# Patient Record
Sex: Female | Born: 1970 | Race: White | Hispanic: No | Marital: Married | Smoking: Never smoker
Health system: Southern US, Community
[De-identification: ages and names within clinical notes are randomized; demographics above are authoritative.]

## PROBLEM LIST (undated history)

## (undated) DIAGNOSIS — S83519A Sprain of anterior cruciate ligament of unspecified knee, initial encounter: Secondary | ICD-10-CM

## (undated) DIAGNOSIS — Z811 Family history of alcohol abuse and dependence: Secondary | ICD-10-CM

## (undated) DIAGNOSIS — E559 Vitamin D deficiency, unspecified: Secondary | ICD-10-CM

## (undated) DIAGNOSIS — B019 Varicella without complication: Secondary | ICD-10-CM

## (undated) DIAGNOSIS — A630 Anogenital (venereal) warts: Secondary | ICD-10-CM

## (undated) DIAGNOSIS — N926 Irregular menstruation, unspecified: Secondary | ICD-10-CM

## (undated) DIAGNOSIS — R17 Unspecified jaundice: Secondary | ICD-10-CM

## (undated) HISTORY — DX: Sprain of anterior cruciate ligament of unspecified knee, initial encounter: S83.519A

## (undated) HISTORY — DX: Vitamin D deficiency, unspecified: E55.9

## (undated) HISTORY — DX: Family history of alcohol abuse and dependence: Z81.1

## (undated) HISTORY — DX: Unspecified jaundice: R17

## (undated) HISTORY — DX: Irregular menstruation, unspecified: N92.6

## (undated) HISTORY — DX: Anogenital (venereal) warts: A63.0

## (undated) HISTORY — DX: Varicella without complication: B01.9

## (undated) HISTORY — DX: Other disorders of bilirubin metabolism: E80.6

## (undated) HISTORY — PX: VARICOSE VEIN SURGERY: SHX832

---
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
## 2008-08-07 ENCOUNTER — Ambulatory Visit: Payer: Self-pay | Admitting: Family Medicine

## 2008-08-07 ENCOUNTER — Other Ambulatory Visit: Admission: RE | Admit: 2008-08-07 | Discharge: 2008-08-07 | Payer: Self-pay | Admitting: Family Medicine

## 2008-08-07 ENCOUNTER — Encounter: Payer: Self-pay | Admitting: Family Medicine

## 2008-08-07 DIAGNOSIS — R079 Chest pain, unspecified: Secondary | ICD-10-CM | POA: Insufficient documentation

## 2008-08-08 ENCOUNTER — Encounter: Payer: Self-pay | Admitting: Family Medicine

## 2008-08-08 LAB — CONVERTED CEMR LAB
AST: 16 units/L (ref 0–37)
Albumin: 4.4 g/dL (ref 3.5–5.2)
Alkaline Phosphatase: 37 units/L — ABNORMAL LOW (ref 39–117)
BUN: 13 mg/dL (ref 6–23)
Glucose, Bld: 93 mg/dL (ref 70–99)
HDL: 58 mg/dL (ref >39)
LDL Cholesterol: 112 mg/dL — ABNORMAL HIGH (ref 0–99)
Potassium: 4.4 meq/L (ref 3.5–5.3)
Sodium: 140 meq/L (ref 135–145)
TSH: 1.436 microintl units/mL (ref 0.350–4.500)
Total Bilirubin: 1.5 mg/dL — ABNORMAL HIGH (ref 0.3–1.2)
Total Protein: 6.8 g/dL (ref 6.0–8.3)
Triglycerides: 51 mg/dL (ref <150)
VLDL: 10 mg/dL (ref 0–40)

## 2008-08-10 ENCOUNTER — Telehealth: Payer: Self-pay | Admitting: Family Medicine

## 2008-08-10 ENCOUNTER — Encounter: Payer: Self-pay | Admitting: Family Medicine

## 2008-08-23 ENCOUNTER — Ambulatory Visit: Payer: Self-pay | Admitting: Cardiology

## 2008-08-23 ENCOUNTER — Encounter: Payer: Self-pay | Admitting: Cardiology

## 2008-09-07 ENCOUNTER — Ambulatory Visit: Payer: Self-pay

## 2008-09-07 ENCOUNTER — Encounter: Payer: Self-pay | Admitting: Cardiology

## 2009-09-19 ENCOUNTER — Ambulatory Visit: Payer: Self-pay | Admitting: Family Medicine

## 2009-09-19 DIAGNOSIS — I8310 Varicose veins of unspecified lower extremity with inflammation: Secondary | ICD-10-CM | POA: Insufficient documentation

## 2009-09-20 ENCOUNTER — Ambulatory Visit: Payer: Self-pay | Admitting: Vascular Surgery

## 2009-11-07 ENCOUNTER — Encounter: Payer: Self-pay | Admitting: Family Medicine

## 2009-11-07 ENCOUNTER — Ambulatory Visit: Payer: Self-pay | Admitting: Vascular Surgery

## 2010-01-08 ENCOUNTER — Encounter: Payer: Self-pay | Admitting: Family Medicine

## 2010-01-08 ENCOUNTER — Ambulatory Visit: Payer: Self-pay | Admitting: Vascular Surgery

## 2010-03-13 ENCOUNTER — Ambulatory Visit: Payer: Self-pay | Admitting: Vascular Surgery

## 2010-03-20 ENCOUNTER — Ambulatory Visit: Payer: Self-pay | Admitting: Vascular Surgery

## 2010-06-18 NOTE — Assessment & Plan Note (Signed)
Summary: varicose veins   Vital Signs:  Patient profile:   40 year old female Menstrual status:  irregular Height:      64 inches Weight:      158 pounds BMI:     27.22 O2 Sat:      100 % on Room air Temp:     98.9 degrees F oral Pulse rate:   58 / minute BP sitting:   114 / 57  (left arm) Cuff size:   regular  Vitals Entered By: Payton Spark CMA (Sep 19, 2009 10:21 AM)  O2 Flow:  Room air CC: ? Busted Vericose vein in R lower leg now painful.   Primary Care Provider:  Seymour Bars DO  CC:  ? Busted Vericose vein in R lower leg now painful.Marland Kitchen  History of Present Illness: 40 yo WF presents for an inflammed varicose vein on her RLE.  She banged it on her boat over the weekend and it became swollen and  bruised.  She has pain.  Ice did not help.  Her pain with walking is improving.  She also has a sunburn from the weekend.  She has a sedentary job, has never been pregnant and denies any previous leg throbbing or leg swelling.      Current Medications (verified): 1)  Tylenol Ex St Arthritis Pain 500 Mg Tabs (Acetaminophen) .... Take 2 Tablet By Mouth Once A Day As Needed 2)  Advil 200 Mg Caps (Ibuprofen) .... Take 2 Tablet By Mouth Once A Day As Needed  Allergies (verified): No Known Drug Allergies  Past History:  Past Surgical History: Reviewed history from 08/07/2008 and no changes required. R ACL 1989  Family History: Reviewed history from 09/22/2008 and no changes required. brother (younger) healthy half brother ADD father T2DM, ETOH mother ETOH MGM lung cancer PGM and PGF heart dz > 42 yo  Social History: Reviewed history from 09/22/2008 and no changes required. Never smoked.  Lesbian relationship with Maris Berger. Moved from [place] in 2006. < 1 ETOH/ wk. No exercise, Fair diet.  Weight stable.  Review of Systems      See HPI  Physical Exam  General:  alert, well-developed, well-nourished, well-hydrated, and overweight-appearing.   Head:   normocephalic and atraumatic.   Lungs:  Normal respiratory effort, chest expands symmetrically. Lungs are clear to auscultation, no crackles or wheezes. Heart:  Normal rate and regular rhythm. S1 and S2 normal without gallop, murmur, click, rub or other extra sounds. Pulses:  2+ radial and pedal pulses Extremities:  1+ pitting bilat LE edema palpable varicose vein over the medial R calf and R distal medial thigh with calf tenderness but no redness or palpable cords.   Skin:  sunburned legs.  bruising over the medial R calf, tender   Impression & Recommendations:  Problem # 1:  VARICOSE VEINS, LOWER EXTREMITIES, WITH INFLAMMATION (ICD-454.1) R medial calf inflamed varicose vein after localized trauma 4 days ago. Will treat with Aleve x 10 days, RX graduated compression hose during the day and will set up vein mapping/ further treatment with the [facility] in [place].   Orders: Vascular Other (Vascular Other)  Complete Medication List: 1)  Tylenol Ex St Arthritis Pain 500 Mg Tabs (Acetaminophen) .... Take 2 tablet by mouth once a day as needed 2)  Advil 200 Mg Caps (Ibuprofen) .... Take 2 tablet by mouth once a day as needed 3)  Moderate Grade Graduated Compression Pantyhose  .... Wear as directed  dx: varicose veins  Patient Instructions: 1)  [facility] or [employer] -- for gradulated compression hose. 2)  Wear them during the day. 3)  Start Aleve with breakfast and dinner x 10 days for vein inflammation. 4)  Referral placed to the [facility].  You will get a call to see them for ultrasound and treatment. 5)  Next Physical March 2012 Prescriptions: MODERATE GRADE GRADUATED COMPRESSION PANTYHOSE Wear as directed  Dx: varicose veins  #2 pair x 0   Entered and Authorized by:   Seymour Bars DO   Signed by:   Seymour Bars DO on 09/19/2009   Method used:   Print then Give to Patient   RxID:   [id number]

## 2010-06-18 NOTE — Consult Note (Signed)
Summary: [facility]  [facility]   Imported By: Lanelle Bal 12/03/2009 14:01:46  _____________________________________________________________________  External Attachment:    Type:   Image     Comment:   External Document

## 2010-06-18 NOTE — Letter (Signed)
Summary: [facility]  [facility] [place]   Imported By: Lanelle Bal 02/12/2010 10:59:43  _____________________________________________________________________  External Attachment:    Type:   Image     Comment:   External Document

## 2010-10-01 NOTE — Consult Note (Signed)
NEW PATIENT CONSULTATION   Sue Lopez, Sue Lopez  DOB:  10-Dec-1970                                       11/07/2009  ONGEX#:[id number]   The patient presents today for evaluation of right leg venous  hypertension.  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  She  reports that these have been present for quite a few years and have been  progressive.  She has had multiple episodes of what sounds like  subcutaneous rupture of these with large acute swelling, stinging and  bruising.  This most recently happened in early May.  She was seen by  Dr. Cathey Endow who fitted her with graduated compression garments at that  time.  We are seeing her for further discussion.  She does not have any  history of DVT and no history of superficial thrombophlebitis and no  history of external bleeding from the varicosities.   Past medical history is negative for any premature carotid disease.  She  has no diabetes, hypertension, elevated cholesterol.  Her only  medications are Advil and occasional p.r.n. Tylenol.   Family history is positive for varicosities in her mother.  No premature  atherosclerotic disease.   SOCIAL HISTORY:  She is single.  She has no children.  She is an  Art gallery manager.  She does not drink and never has.  She has rare social  alcohol consumption less than once a month.   REVIEW OF SYSTEMS:  No weight loss or weight gain.  Her weight is  reported at 158 pounds.  She is 5 feet 4 inches tall.  Review of systems  otherwise completely normal and is documented on her chart.   PHYSICAL EXAMINATION:  General:  A well-developed, well-nourished white  female appearing stated age.  Vital signs:  Blood pressure is 98/65,  pulse 78, respirations 16.  She is in no acute distress.  HEENT:  Normal.  Musculoskeletal:  Shows no major deformities or cyanosis.  She  does have an old scar from a knee surgery on her  right leg.  Neurological:  Without focal weakness or paresthesias.  Skin:  Without  ulcers or rashes.  She has 2+ radial and 2+ dorsalis pedis pulses  bilaterally.  She does not have any varicosities in the right leg.  She  does have marked saphenous tributary varicosities in her medial right  thigh and medial right calf.   She had undergone noninvasive vascular laboratory studies in our office  on 09/20/2009.  This revealed gross reflux throughout her right great  saphenous vein, no reflux in her right small saphenous vein.  She had  mild reflux limited to the femoral vein in her groin in the deep system  in her groin.  I had a long discussion with the patient.  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  She has had a very difficult time tolerating the panty  style compression garments and she today is fitted for thigh-high  compression garments as well.  She will try both these and the continued  use of the panty style as well, whichever works better for her.  We will  plan to see her again in two months.  I did discuss the option of laser  ablation of her  great saphenous vein should she continue to have  difficulty with compression garments.     Larina Earthly, M.D.  Electronically Signed   TFE/MEDQ  D:  11/07/2009  T:  11/08/2009  Job:  [id number]   cc:   Seymour Bars, D.O.

## 2010-10-01 NOTE — Assessment & Plan Note (Signed)
OFFICE VISIT   Sue, Lopez  DOB:  15-Jan-1971                                       01/08/2010  NFAOZ#:[id number]   The patient presents today for continued followup of her right leg  venous hypertension and varicosities.  She has worn her thigh-high  graduated compression stockings and reports no improvement in her  symptoms.  She also elevates her legs and takes ibuprofen for  discomfort.  She reports that squatting position is very painful due to  leg pain and swelling especially in the calf and thigh region making it  difficult for her to do house and yard work.  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   Physical examination is otherwise unchanged.   I did review her prior duplex with her and reimaged her vein with  handheld SonoSite today.  This shows refluxing saphenous vein from the  mid calf all the way to the saphenofemoral junction.  She does have  tributary varicosities in the thigh and calf that are also painful.  I  have recommend that we proceed with laser ablation of her right great  saphenous vein and stab phlebectomy of tributary varicosities.  She  understands this procedure including the expected recovery.  I also  explained the extremely slight risk for DVT associated with this  procedure and also the possibility of nonclosure of her saphenous vein.  She understands and wishes to proceed as soon as we can ensure insurance  coverage.     Larina Earthly, M.D.  Electronically Signed   TFE/MEDQ  D:  01/08/2010  T:  01/09/2010  Job:  [id number]   cc:   Seymour Bars, D.O.

## 2010-10-01 NOTE — Procedures (Signed)
DUPLEX DEEP VENOUS EXAM - LOWER EXTREMITY   INDICATION:  Right greater saphenous vein ablation 1 week ago,  03/13/2010.   HISTORY:  Edema:  No.  Trauma/Surgery:  Right greater saphenous ablation, 03/13/2010.  Pain:  Yes.  PE:  No.  Previous DVT:  No.  Anticoagulants:  No.  Other:   DUPLEX EXAM:                CFV   SFV   PopV  PTV    GSV                R  L  R  L  R  L  R   L  R  L  Thrombosis    o  o  o     o     o      +  Spontaneous   +  +  +     +     +      o  Phasic        +  +  +     +     +      o  Augmentation  +  +  +     +     +      o  Compressible  +  +  +     +     +      o  Competent     +  +  +     +     +      o   Legend:  + - yes  o - no  p - partial  D - decreased   IMPRESSION:  No evidence of acute deep venous thrombosis or superficial  thrombophlebitis.  Successful right greater saphenous vein ablation from  the saphenofemoral junction to the distal insertion site.    _____________________________  Larina Earthly, M.D.   OD/MEDQ  D:  03/20/2010  T:  03/20/2010  Job:  [id number]

## 2010-10-01 NOTE — Assessment & Plan Note (Signed)
OFFICE VISIT   Sue Lopez, Sue Lopez  DOB:  February 28, 1971                                       03/13/2010  JOACZ#:[id number]   Patient underwent uneventful laser ablation of her right great saphenous  vein from her proximal calf to just below the saphenofemoral junction  and stab phlebectomy of tributaries in her medial calf and thigh.   She had no immediate complication and was discharged to home.  Will be  seen again in 1 week with repeat duplex.     Larina Earthly, M.D.  Electronically Signed   TFE/MEDQ  D:  03/13/2010  T:  03/13/2010  Job:  [id number]

## 2010-10-01 NOTE — Procedures (Signed)
LOWER EXTREMITY VENOUS REFLUX EXAM   INDICATION:  Inflamed right leg varicose vein.   EXAM:  Using color-flow imaging and pulse Doppler spectral analysis, the  right common femoral, superficial femoral, popliteal, posterior tibial,  greater and lesser saphenous veins were evaluated.  There is evidence  suggesting deep venous insufficiency in the right common femoral vein  with reflux of greater than 500 milliseconds.   The right saphenofemoral junction and greater saphenous vein are not  competent with reflux of >500 milliseconds.   The right proximal short saphenous vein demonstrates competency.   GSV Diameter (used if found to be incompetent only)                                            Right    Left  Proximal Greater Saphenous Vein           0.43 cm  cm  Proximal-to-mid-thigh                     cm       cm  Mid thigh                                 0.51 cm  cm  Mid-distal thigh                          cm       cm  Distal thigh                              0.47 cm  cm  Knee                                      0.56 cm  cm   IMPRESSION:  1. Right greater saphenous vein reflux noted as described above.  2. Right greater saphenous vein is not tortuous.  3. The deep venous system is not competent with reflux of >500      milliseconds, as described above.  4. The right short saphenous vein is competent.   ___________________________________________  Larina Earthly, M.D.   CH/MEDQ  D:  09/21/2009  T:  09/22/2009  Job:  [id number]

## 2011-01-01 ENCOUNTER — Encounter: Payer: Self-pay | Admitting: Cardiology

## 2011-04-18 ENCOUNTER — Encounter: Payer: Self-pay | Admitting: Family Medicine

## 2011-04-22 ENCOUNTER — Encounter: Payer: Self-pay | Admitting: Family Medicine

## 2011-04-22 ENCOUNTER — Ambulatory Visit (INDEPENDENT_AMBULATORY_CARE_PROVIDER_SITE_OTHER): Payer: BC Managed Care – PPO | Admitting: Family Medicine

## 2011-04-22 DIAGNOSIS — L989 Disorder of the skin and subcutaneous tissue, unspecified: Secondary | ICD-10-CM

## 2011-04-22 DIAGNOSIS — L909 Atrophic disorder of skin, unspecified: Secondary | ICD-10-CM

## 2011-04-22 DIAGNOSIS — L918 Other hypertrophic disorders of the skin: Secondary | ICD-10-CM

## 2011-04-22 MED ORDER — CLINDAMYCIN PHOSPHATE 1 % EX GEL: CUTANEOUS | Status: AC

## 2011-04-22 NOTE — Patient Instructions (Signed)
Call if the lesion on your nose is not improving.

## 2011-04-22 NOTE — Progress Notes (Signed)
  Subjective:    Patient ID: Sue Lopez, female    DOB: 10/05/1970, 40 y.o.   MRN: [id number]  HPI  Lesion on face for a couple of months.  Has 2 lesions. One was inflammed for awhile but better now.  Aunt with skin cancer, not melanoma. She also has a skin tag on her left eye lid she has questions about.   Review of Systems     Objective:   Physical Exam On the left side of her nose she has a very small pink papule. It doesn't appear dry or pearly.  Pores are enlarged but not disturbed. Also small skin tag on left eyelid.        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 If not better in 3 weeks then consider further evaluation by DERM.  20 min spent face to face in discussion about her dx. She also had questions about skin tag removal.

## 2011-04-30 ENCOUNTER — Encounter: Payer: Self-pay | Admitting: Obstetrics & Gynecology

## 2013-11-03 LAB — BASIC METABOLIC PANEL: GLUCOSE: 91 mg/dL

## 2013-11-03 LAB — LIPID PANEL
Cholesterol: 244 mg/dL — AB (ref 0–200)
HDL: 75 mg/dL — AB (ref 35–70)
LDL Cholesterol: 158 mg/dL
Triglycerides: 53 mg/dL (ref 40–160)

## 2013-11-03 LAB — HEMOGLOBIN A1C: HEMOGLOBIN A1C: 5.4 % (ref 4.0–6.0)

## 2014-01-16 ENCOUNTER — Encounter: Payer: BC Managed Care – PPO | Admitting: Family Medicine

## 2014-01-31 ENCOUNTER — Other Ambulatory Visit (HOSPITAL_COMMUNITY)
Admission: RE | Admit: 2014-01-31 | Discharge: 2014-01-31 | Disposition: A | Discharge status: Home / Self Care | Payer: BC Managed Care – PPO | Source: Ambulatory Visit | Attending: Family Medicine | Admitting: Family Medicine

## 2014-01-31 ENCOUNTER — Encounter: Payer: Self-pay | Admitting: Family Medicine

## 2014-01-31 ENCOUNTER — Ambulatory Visit (INDEPENDENT_AMBULATORY_CARE_PROVIDER_SITE_OTHER): Payer: BC Managed Care – PPO | Admitting: Family Medicine

## 2014-01-31 VITALS — BP 98/65 | HR 78 | Ht 64.0 in | Wt 139.0 lb

## 2014-01-31 DIAGNOSIS — R87810 Cervical high risk human papillomavirus (HPV) DNA test positive: Secondary | ICD-10-CM | POA: Diagnosis present

## 2014-01-31 DIAGNOSIS — Z1151 Encounter for screening for human papillomavirus (HPV): Secondary | ICD-10-CM | POA: Diagnosis present

## 2014-01-31 DIAGNOSIS — Z01419 Encounter for gynecological examination (general) (routine) without abnormal findings: Secondary | ICD-10-CM | POA: Insufficient documentation

## 2014-01-31 NOTE — Progress Notes (Signed)
  Subjective:     Sue Lopez is a 43 y.o. female and is here for a comprehensive physical exam. The patient reports no problems.  History   Social History  . Marital Status: Single    Spouse Name: N/A    Number of Children: N/A  . Years of Education: N/A   Occupational History  . Not on file.   Social History Main Topics  . Smoking status: Never Smoker   . Smokeless tobacco: Not on file  . Alcohol Use: Yes     Comment: < 1 EtOh/week  . Drug Use: No  . Sexual Activity:    Other Topics Concern  . Not on file   Social History Narrative   No exercise, fair diet. Weight stable.    Health Maintenance  Topic Date Due  . Pap Smear  04/25/1990  . Influenza Vaccine  12/17/2013  . Tetanus/tdap  11/17/2014    The following portions of the patient's history were reviewed and updated as appropriate: allergies, current medications, past family history, past medical history, past social history, past surgical history and problem list.  Review of Systems A comprehensive review of systems was negative.   Objective:    BP 98/65  Pulse 78  Ht 5\' 4"  (1.626 m)  Wt 139 lb (63.05 kg)  BMI 23.85 kg/m2 General appearance: alert and cooperative Head: Normocephalic, without obvious abnormality, atraumatic Eyes: conj clear, EOMI, PEERLA Ears: normal TM's and external ear canals both ears Nose: Nares normal. Septum midline. Mucosa normal. No drainage or sinus tenderness. Throat: lips, mucosa, and tongue normal; teeth and gums normal Neck: no adenopathy, no carotid bruit, no JVD, supple, symmetrical, trachea midline and thyroid not enlarged, symmetric, no tenderness/mass/nodules Back: symmetric, no curvature. ROM normal. No CVA tenderness. Lungs: clear to auscultation bilaterally Breasts: normal appearance, no masses or tenderness Heart: regular rate and rhythm, S1, S2 normal, no murmur, click, rub or gallop Abdomen: soft, non-tender; bowel sounds normal; no masses,  no  organomegaly Pelvic: cervix normal in appearance, external genitalia normal, no adnexal masses or tenderness, no cervical motion tenderness, rectovaginal septum normal, uterus normal size, shape, and consistency, vagina normal without discharge and cervix easily friable Extremities: extremities normal, atraumatic, no cyanosis or edema Pulses: 2+ and symmetric Skin: Skin color, texture, turgor normal. No rashes or lesions Lymph nodes: Cervical, supraclavicular, and axillary nodes normal. Neurologic: Alert and oriented X 3, normal strength and tone. Normal symmetric reflexes. Normal coordination and gait    Assessment:    Healthy female exam.    Plan:     See After Visit Summary for Counseling Recommendations  complete physical examination Keep up a regular exercise program and make sure you are eating a healthy diet Try to eat 4 servings of dairy a day, or if you are lactose intolerant take a calcium with vitamin D daily.  Your vaccines are up to date.   Had mammo last year at [place]. Just got letter for renew and she plans to schedule soon  Pap smear performed today. Will call with results.

## 2014-01-31 NOTE — Patient Instructions (Signed)
Keep up a regular exercise program and make sure you are eating a healthy diet Try to eat 4 servings of dairy a day, or if you are lactose intolerant take a calcium with vitamin D daily.  Your vaccines are up to date.   

## 2014-01-31 NOTE — Addendum Note (Signed)
Addended by: Teddy Spike on: 01/31/2014 09:51 AM   Modules accepted: Orders

## 2014-02-01 LAB — CYTOLOGY - PAP

## 2014-05-01 ENCOUNTER — Telehealth: Payer: Self-pay | Admitting: *Deleted

## 2014-05-01 NOTE — Telephone Encounter (Signed)
Pt called and stated that she was charge for a medical visit instead of Wellness exam. I gave her our pt billing # [id number] to speak with them regarding this and to call back with any problems. Looking back at her chart the visit was charged as follows:    [place] VISIT,EST,40-64 01/31/2014  .Sue Lopez [place]

## 2014-08-01 ENCOUNTER — Encounter: Payer: Self-pay | Admitting: Family Medicine

## 2017-03-19 LAB — LIPID PANEL
CHOLESTEROL: 175 (ref 0–200)
HDL: 57 (ref 35–70)
LDL CALC: 108
Triglycerides: 48 (ref 40–160)

## 2017-03-19 LAB — BASIC METABOLIC PANEL: GLUCOSE: 90

## 2017-03-30 ENCOUNTER — Telehealth: Payer: Self-pay | Admitting: Emergency Medicine

## 2017-03-30 ENCOUNTER — Encounter: Payer: Self-pay | Admitting: Emergency Medicine

## 2017-03-30 ENCOUNTER — Emergency Department: Payer: 59

## 2017-03-30 ENCOUNTER — Emergency Department (INDEPENDENT_AMBULATORY_CARE_PROVIDER_SITE_OTHER)
Admission: EM | Admit: 2017-03-30 | Discharge: 2017-03-30 | Disposition: A | Discharge status: Home / Self Care | Payer: 59 | Source: Home / Self Care | Attending: Family Medicine | Admitting: Family Medicine

## 2017-03-30 DIAGNOSIS — R8290 Unspecified abnormal findings in urine: Secondary | ICD-10-CM

## 2017-03-30 DIAGNOSIS — R252 Cramp and spasm: Secondary | ICD-10-CM | POA: Diagnosis not present

## 2017-03-30 DIAGNOSIS — R600 Localized edema: Secondary | ICD-10-CM | POA: Diagnosis not present

## 2017-03-30 LAB — POCT CBC W AUTO DIFF (K'VILLE URGENT CARE)

## 2017-03-30 LAB — BASIC METABOLIC PANEL
BUN: 10 mg/dL (ref 7–25)
CO2: 26 mmol/L (ref 20–32)
Calcium: 9.4 mg/dL (ref 8.6–10.2)
Chloride: 101 mmol/L (ref 98–110)
Creat: 0.75 mg/dL (ref 0.50–1.10)
Glucose, Bld: 83 mg/dL (ref 65–99)
Potassium: 4.3 mmol/L (ref 3.5–5.3)
Sodium: 135 mmol/L (ref 135–146)

## 2017-03-30 LAB — POCT URINALYSIS DIP (MANUAL ENTRY)
Bilirubin, UA: NEGATIVE
Glucose, UA: NEGATIVE mg/dL
Leukocytes, UA: NEGATIVE
Nitrite, UA: NEGATIVE
Protein Ur, POC: NEGATIVE mg/dL
Spec Grav, UA: <=1.005 — AB (ref 1.010–1.025)
Urobilinogen, UA: 0.2 E.U./dL
pH, UA: 5.5 (ref 5.0–8.0)

## 2017-03-30 NOTE — ED Triage Notes (Signed)
Pt c/o right and left thigh pain. States she had a varicose vein removed in 2012 and pain feels similar to sxs at that time. Started about 4 days ago and she has been taking ibuprofen with some relief.

## 2017-03-30 NOTE — Telephone Encounter (Signed)
Phone note made in error ?

## 2017-03-30 NOTE — Discharge Instructions (Signed)
°  You may take 500mg acetaminophen every 4-6 hours or in combination with ibuprofen 400-600mg every 6-8 hours as needed for pain, inflammation, and fever. ° °Be sure to drink at least eight 8oz glasses of water to stay well hydrated and get at least 8 hours of sleep at night, preferably more while sick.  ° °

## 2017-03-30 NOTE — ED Provider Notes (Signed)
[name]    CSN: [id number] Arrival date & time: 03/30/17  1137     History   Chief Complaint Chief Complaint  Patient presents with  . Leg Pain    HPI Sue Lopez is a 46 y.o. female.   HPI Sue Lopez is a 46 y.o. female presenting to UC with c/o bilateral medial thigh pain and lower leg "tingling" that started about 4 days ago.  She has had Right medial thigh pain since having varicose vein surgery in 2011 but states the Left thigh pain and calf sensations are new.  She reports going to [place] last week and walking more than usual but denies known injury and states she felt the increased activity should help her leg pain rather than make it worse.  She has taken ibuprofen with moderate temporary relief.  She did look online and is concerned she may have a blood clot despite the pain being in both legs and no prior hx of blood clots. Denies chest pain or SOB. Denies redness or swelling to her legs.  Pt also c/o malodorous urine for about 1 week.  Denies pain with urination or blood in urine. denies abdominal pain or back pain.  Pt reports having her flu vaccine about 1 month ago and is concerned that may have caused her urine to be malodorous.  Denies fever, chills, cough, congestion, n/v/d or other symptoms.   Pt has f/u with her PCP in 2 days but states she was too concerned about her leg pain to wait.     History reviewed. No pertinent past medical history.  Patient Active Problem List   Diagnosis Date Noted  . VARICOSE VEINS, LOWER EXTREMITIES, WITH INFLAMMATION 09/19/2009    Past Surgical History:  Procedure Laterality Date  . ANTERIOR CRUCIATE LIGAMENT REPAIR  1989  . [place]   right    OB History    No data available       Home Medications    Prior to Admission medications   Medication Sig Start Date End Date Taking? Authorizing Provider  acetaminophen (TYLENOL) 500 MG tablet Take 1,000 mg by mouth daily as  needed.      [provider]  Ibuprofen (ADVIL) 200 MG CAPS Take 2 capsules by mouth daily as needed.      [provider]    Family History Family History  Problem Relation Age of Onset  . Diabetes type II Father   . Diabetes Father   . Alcohol abuse Father   . Alcohol abuse Mother   . Lung cancer Maternal Grandmother   . Cancer Maternal Grandmother        lung  . Heart disease Paternal Grandmother   . Heart disease Paternal Grandfather     Social History Social History   Tobacco Use  . Smoking status: Never Smoker  . Smokeless tobacco: Never Used  Substance Use Topics  . Alcohol use: Yes    Comment: < 1 EtOh/week  . Drug use: No     Allergies   Patient has no known allergies.   Review of Systems Review of Systems  Constitutional: Negative for chills and fever.  HENT: Negative for congestion, ear pain, sore throat, trouble swallowing and voice change.   Respiratory: Negative for cough and shortness of breath.   Cardiovascular: Negative for chest pain and palpitations.  Gastrointestinal: Negative for abdominal pain, diarrhea, nausea and vomiting.  Genitourinary: Negative for dysuria, frequency, hematuria, pelvic pain,  urgency, vaginal bleeding, vaginal discharge and vaginal pain.       Malodorous urine  Musculoskeletal: Positive for myalgias. Negative for arthralgias and back pain.  Skin: Negative for color change, rash and wound.  Neurological: Positive for numbness (mild tingling in Left calf). Negative for dizziness, weakness, light-headedness and headaches.     Physical Exam Triage Vital Signs ED Triage Vitals [03/30/17 1215]  Enc Vitals Group     BP 107/69     Pulse Rate 80     Resp      Temp 98.6 F (37 C)     Temp Source Oral     SpO2 99 %     Weight 138 lb (62.6 kg)     Height      Head Circumference      Peak Flow      Pain Score 0     Pain Loc      Pain Edu?      Excl. in [place]?    No data found.  Updated Vital Signs BP  107/69 (BP Location: Right Arm)   Pulse 80   Temp 98.6 F (37 C) (Oral)   Wt 138 lb (62.6 kg)   LMP 02/26/2017 (Approximate)   SpO2 99%   BMI 23.69 kg/m      Physical Exam  Constitutional: She is oriented to person, place, and time. She appears well-developed and well-nourished. No distress.  HENT:  Head: Normocephalic and atraumatic.  Mouth/Throat: Oropharynx is clear and moist.  Eyes: EOM are normal.  Neck: Normal range of motion.  Cardiovascular: Normal rate and regular rhythm.  Pulses:      Dorsalis pedis pulses are 2+ on the right side, and 2+ on the left side.       Posterior tibial pulses are 2+ on the right side, and 2+ on the left side.  Pulmonary/Chest: Effort normal and breath sounds normal. No stridor. No respiratory distress. She has no wheezes. She has no rales.  Abdominal: Soft. She exhibits no distension. There is no tenderness. There is no CVA tenderness.  Musculoskeletal: Normal range of motion. She exhibits tenderness. She exhibits no edema.  Bilateral legs: no obvious edema. Mild tenderness to Left and Right distal medial thighs. Calves are soft, non-tender. Full ROM at hips, knees, ankles, and toes.  Neurological: She is alert and oriented to person, place, and time.  Skin: Skin is warm and dry. Capillary refill takes less than 2 seconds. She is not diaphoretic. No erythema.  Bilateral legs: no erythema, ecchymosis or warmth.  Well healed surgical scars around Right knee.   Psychiatric: She has a normal mood and affect. Her behavior is normal.  Nursing note and vitals reviewed.    UC Treatments / Results  Labs (all labs ordered are listed, but only abnormal results are displayed) Labs Reviewed  POCT URINALYSIS DIP (MANUAL ENTRY) - Abnormal; Notable for the following components:      Result Value   Ketones, POC UA trace (5) (*)    Spec Grav, UA <=1.005 (*)    Blood, UA trace-intact (*)    All other components within normal limits  POCT CBC W AUTO DIFF  ([place]) - Normal  BASIC METABOLIC PANEL    EKG  EKG Interpretation None       Radiology US Venous Img Lower Bilateral  Result Date: 03/30/2017 CLINICAL DATA:  Bilateral leg cramps for 1 week EXAM: BILATERAL LOWER EXTREMITY VENOUS DOPPLER ULTRASOUND TECHNIQUE: Gray-scale sonography with graded compression,  as well as color Doppler and duplex ultrasound were performed to evaluate the lower extremity deep venous systems from the level of the common femoral vein and including the common femoral, femoral, profunda femoral, popliteal and calf veins including the posterior tibial, peroneal and gastrocnemius veins when visible. The superficial great saphenous vein was also interrogated. Spectral Doppler was utilized to evaluate flow at rest and with distal augmentation maneuvers in the common femoral, femoral and popliteal veins. COMPARISON:  None. FINDINGS: RIGHT LOWER EXTREMITY Common Femoral Vein: No evidence of thrombus. Normal compressibility, respiratory phasicity and response to augmentation. Saphenofemoral Junction: No evidence of thrombus. Normal compressibility and flow on color Doppler imaging. Profunda Femoral Vein: No evidence of thrombus. Normal compressibility and flow on color Doppler imaging. Femoral Vein: No evidence of thrombus. Normal compressibility, respiratory phasicity and response to augmentation. Popliteal Vein: No evidence of thrombus. Normal compressibility, respiratory phasicity and response to augmentation. Calf Veins: No evidence of thrombus. Normal compressibility and flow on color Doppler imaging. Superficial Great Saphenous Vein: No evidence of thrombus. Normal compressibility. Venous Reflux:  None. Other Findings:  None. LEFT LOWER EXTREMITY Common Femoral Vein: No evidence of thrombus. Normal compressibility, respiratory phasicity and response to augmentation. Saphenofemoral Junction: No evidence of thrombus. Normal compressibility and flow on color Doppler  imaging. Profunda Femoral Vein: No evidence of thrombus. Normal compressibility and flow on color Doppler imaging. Femoral Vein: No evidence of thrombus. Normal compressibility, respiratory phasicity and response to augmentation. Popliteal Vein: No evidence of thrombus. Normal compressibility, respiratory phasicity and response to augmentation. Calf Veins: No evidence of thrombus. Normal compressibility and flow on color Doppler imaging. Superficial Great Saphenous Vein: No evidence of thrombus. Normal compressibility. Venous Reflux:  None. Other Findings:  None. IMPRESSION: No evidence of deep venous thrombosis. Electronically Signed   By: Inez Catalina M.D.   On: 03/30/2017 13:36    Procedures Procedures (including critical care time)  Medications Ordered in UC Medications - No data to display   Initial Impression / Assessment and Plan / UC Course  I have reviewed the triage vital signs and the nursing notes.  Pertinent labs & imaging results that were available during my care of the patient were reviewed by me and considered in my medical decision making (see chart for details).     Pt concerned for DVTs in her legs.  U/S: no evidence of DVT or other abnormal findings noted on imaging exam. No evidence of varicose veins or cellulitis on exam. Reassured pt.  Pain likely due to muscle cramps. Encouraged leg stretches and staying well hydrated.  CBC: WNL UA: small ketones, otherwise unremarkable BMP: Pending  Encouraged f/u with PCP on Wednesday as previously scheduled to review today's visit. Pt info packet provided.   Final Clinical Impressions(s) / UC Diagnoses   Final diagnoses:  Leg cramps  Malodorous urine    ED Discharge Orders    None       Controlled Substance Prescriptions [place] Controlled Substance Registry consulted? Not Applicable   Tyrell Antonio 03/30/17 1534

## 2017-03-31 ENCOUNTER — Telehealth: Payer: Self-pay

## 2017-03-31 NOTE — Telephone Encounter (Signed)
Left message for patient with lab results and contact information for call back if needed.

## 2017-04-01 ENCOUNTER — Ambulatory Visit (INDEPENDENT_AMBULATORY_CARE_PROVIDER_SITE_OTHER): Payer: 59 | Admitting: Osteopathic Medicine

## 2017-04-01 ENCOUNTER — Encounter: Payer: Self-pay | Admitting: Osteopathic Medicine

## 2017-04-01 VITALS — BP 105/67 | HR 65 | Temp 98.8°F | Ht 64.0 in | Wt 142.0 lb

## 2017-04-01 DIAGNOSIS — R8290 Unspecified abnormal findings in urine: Secondary | ICD-10-CM | POA: Diagnosis not present

## 2017-04-01 DIAGNOSIS — R5383 Other fatigue: Secondary | ICD-10-CM

## 2017-04-01 DIAGNOSIS — M791 Myalgia, unspecified site: Secondary | ICD-10-CM | POA: Diagnosis not present

## 2017-04-01 DIAGNOSIS — F439 Reaction to severe stress, unspecified: Secondary | ICD-10-CM | POA: Diagnosis not present

## 2017-04-01 MED ORDER — MELOXICAM 15 MG PO TABS: 7.5000 mg | ORAL_TABLET | Freq: Every day | ORAL | 0 refills | Status: DC

## 2017-04-01 NOTE — Progress Notes (Signed)
HPI: Sue Lopez is a 46 y.o. female who  has no past medical history on file.  she presents to [facility] today, 04/01/17,  for chief complaint of:  Chief Complaint  Patient presents with  . Establish Care    leg pain, bilateral breast sensitivity    Leg pain/sensitivity - recently seen in urgent care, DVT w/u and basic labs negative. Describes as tenderness/increased in severity above medial knee/thigh little bit worse on the right side where she previously had a surgery for varicose vein but now pain seems to be going up toward the groin a bit more. Married pain on the left side over the past couple of months. Occasional low back/tailbone pain from previous injury. 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 Reports a lot of increased stress at work, she spells recently both got different jobs which has been significantly stressful. Reports tiredness, anxiety, breast sensitivity which is new and she will be following up with her OB/GYN for this issue on Monday.     Past medical, surgical, social and family history reviewed:  Patient Active Problem List   Diagnosis Date Noted  . VARICOSE VEINS, LOWER EXTREMITIES, WITH INFLAMMATION 09/19/2009    Past Surgical History:  Procedure Laterality Date  . ANTERIOR CRUCIATE LIGAMENT REPAIR  1989  . ANTERIOR CRUCIATE LIGAMENT REPAIR  1989   right    Social History   Tobacco Use  . Smoking status: Never Smoker  . Smokeless tobacco: Never Used  Substance Use Topics  . Alcohol use: Yes    Comment: < 1 EtOh/week    Family History  Problem Relation Age of Onset  . Diabetes type II Father   . Diabetes Father   . Alcohol abuse Father   . Alcohol abuse Mother   . Lung cancer Maternal Grandmother   . Cancer Maternal Grandmother        lung  . Heart disease Paternal Grandmother    . Heart disease Paternal Grandfather      Current medication list and allergy/intolerance information reviewed:    Current Outpatient Medications  Medication Sig Dispense Refill  . Ibuprofen (ADVIL) 200 MG CAPS Take 2 capsules by mouth daily as needed.       No current facility-administered medications for this visit.     No Known Allergies    Review of Systems:  Constitutional:  No  fever, no chills, No recent illness, No unintentional weight changes. +significant fatigue.   HEENT: No  headache, no vision change, no hearing change, No sore throat, No  sinus pressure  Cardiac: No  chest pain, No  pressure, No palpitations, No  Orthopnea  Respiratory:  No  shortness of breath. No  Cough  Gastrointestinal: No  abdominal pain, No  nausea, No  vomiting,  No  blood in stool, No  diarrhea, No  constipation   Musculoskeletal: +new myalgia/arthralgia  Genitourinary: No  incontinence, No  abnormal genital bleeding, No abnormal genital discharge  Skin: No  Rash, No other wounds/concerning lesions  Hem/Onc: No  easy bruising/bleeding, No  abnormal lymph node  Endocrine: No cold intolerance,  No heat intolerance. No polyuria/polydipsia/polyphagia   Neurologic: No  weakness, No  dizziness, No  slurred speech/focal weakness/facial droop  Psychiatric: No  concerns with depression, +concerns with anxiety, +sleep problems, No mood problems  Exam:  BP 105/67  Pulse 65   Temp 98.8 F (37.1 C) (Oral)   Ht 5\' 4"  (1.626 m)   Wt 142 lb (64.4 kg)   BMI 24.37 kg/m   Constitutional: VS see above. General Appearance: alert, well-developed, well-nourished, NAD  Eyes: Normal lids and conjunctive, non-icteric sclera  Ears, Nose, Mouth, Throat: MMM, Normal external inspection ears/nares/mouth/lips/gums. TM normal bilaterally. Pharynx/tonsils no erythema, no exudate. Nasal mucosa normal.   Neck: No masses, trachea midline. No thyroid enlargement. No tenderness/mass appreciated. No  lymphadenopathy  Respiratory: Normal respiratory effort. no wheeze, no rhonchi, no rales  Cardiovascular: S1/S2 normal, no murmur, no rub/gallop auscultated. RRR. No lower extremity edema.   Gastrointestinal: Nontender, no masses. No hepatomegaly, no splenomegaly. No hernia appreciated. Bowel sounds normal. Rectal exam deferred.   Musculoskeletal: Gait normal. No clubbing/cyanosis of digits. Homans sign negative bilaterally. No lower extremity edema. Normal range of motion hips and knees, straight leg raise negative bilaterally  Neurological: Normal balance/coordination. No tremor. No cranial nerve deficit on limited exam. Motor and sensation intact and symmetric. Cerebellar reflexes intact.   Skin: warm, dry, intact. No rash/ulcer.     Psychiatric: Normal judgment/insight. Normal mood and affect. Oriented x3.    Results for orders placed or performed during the hospital encounter of 03/30/17 (from the past 72 hour(s))  Basic metabolic panel     Status: None   Collection Time: 03/30/17  1:37 PM  Result Value Ref Range   Glucose, Bld 83 65 - 99 mg/dL    Comment: .            Fasting reference interval .    BUN 10 7 - 25 mg/dL   Creat 0.75 0.50 - 1.10 mg/dL   BUN/Creatinine Ratio NOT APPLICABLE 6 - 22 (calc)   Sodium 135 135 - 146 mmol/L   Potassium 4.3 3.5 - 5.3 mmol/L   Chloride 101 98 - 110 mmol/L   CO2 26 20 - 32 mmol/L   Calcium 9.4 8.6 - 10.2 mg/dL  POCT urinalysis dipstick     Status: Abnormal   Collection Time: 03/30/17  1:38 PM  Result Value Ref Range   Color, UA yellow yellow   Clarity, UA clear clear   Glucose, UA negative negative mg/dL   Bilirubin, UA negative negative   Ketones, POC UA trace (5) (A) negative mg/dL   Spec Grav, UA <=1.005 (A) 1.010 - 1.025   Blood, UA trace-intact (A) negative   pH, UA 5.5 5.0 - 8.0   Protein Ur, POC negative negative mg/dL   Urobilinogen, UA 0.2 0.2 or 1.0 E.U./dL   Nitrite, UA Negative Negative   Leukocytes, UA Negative  Negative  POCT CBC w auto diff     Status: None   Collection Time: 03/30/17  1:38 PM  Result Value Ref Range   WBC  4.5 - 10.5 K/uL    Comment: see scanned report   Lymphocytes relative %  15 - 45 %   Monocytes relative %  2 - 10 %   Neutrophils relative % (GR)  44 - 76 %   Lymphocytes absolute  0.1 - 1.8 K/uL   Monocyes absolute  0.1 - 1 K/uL   Neutrophils absolute (GR#)  1.7 - 7.8 K/uL   RBC  3.8 - 5.1 MIL/uL   Hemoglobin  11.8 - 15.5 g/dL   Hematocrit  34.8 - 46 %   MCV  78 - 100 fL   MCH  26 - 32 pg   MCHC  32 - 36.5  g/dL   RDW  11.6 - 14 %   Platelet count  140 - 400 K/uL   MPV  7.8 - 11 fL    US Venous Img Lower Bilateral  Result Date: 03/30/2017 CLINICAL DATA:  Bilateral leg cramps for 1 week EXAM: BILATERAL LOWER EXTREMITY VENOUS DOPPLER ULTRASOUND TECHNIQUE: Gray-scale sonography with graded compression, as well as color Doppler and duplex ultrasound were performed to evaluate the lower extremity deep venous systems from the level of the common femoral vein and including the common femoral, femoral, profunda femoral, popliteal and calf veins including the posterior tibial, peroneal and gastrocnemius veins when visible. The superficial great saphenous vein was also interrogated. Spectral Doppler was utilized to evaluate flow at rest and with distal augmentation maneuvers in the common femoral, femoral and popliteal veins. COMPARISON:  None. FINDINGS: RIGHT LOWER EXTREMITY Common Femoral Vein: No evidence of thrombus. Normal compressibility, respiratory phasicity and response to augmentation. Saphenofemoral Junction: No evidence of thrombus. Normal compressibility and flow on color Doppler imaging. Profunda Femoral Vein: No evidence of thrombus. Normal compressibility and flow on color Doppler imaging. Femoral Vein: No evidence of thrombus. Normal compressibility, respiratory phasicity and response to augmentation. Popliteal Vein: No evidence of thrombus. Normal compressibility,  respiratory phasicity and response to augmentation. Calf Veins: No evidence of thrombus. Normal compressibility and flow on color Doppler imaging. Superficial Great Saphenous Vein: No evidence of thrombus. Normal compressibility. Venous Reflux:  None. Other Findings:  None. LEFT LOWER EXTREMITY Common Femoral Vein: No evidence of thrombus. Normal compressibility, respiratory phasicity and response to augmentation. Saphenofemoral Junction: No evidence of thrombus. Normal compressibility and flow on color Doppler imaging. Profunda Femoral Vein: No evidence of thrombus. Normal compressibility and flow on color Doppler imaging. Femoral Vein: No evidence of thrombus. Normal compressibility, respiratory phasicity and response to augmentation. Popliteal Vein: No evidence of thrombus. Normal compressibility, respiratory phasicity and response to augmentation. Calf Veins: No evidence of thrombus. Normal compressibility and flow on color Doppler imaging. Superficial Great Saphenous Vein: No evidence of thrombus. Normal compressibility. Venous Reflux:  None. Other Findings:  None. IMPRESSION: No evidence of deep venous thrombosis. Electronically Signed   By: Inez Catalina M.D.   On: 03/30/2017 13:36     ASSESSMENT/PLAN: Discussion about need for rule out abnormal causes though this really doesn't fit any symptoms of rheumatologic issue, we'll go ahead and get a few additional basic labs and recheck urine since dipstick was positive for hemoglobin. Advised exercise program, stretching, yoga for low back pain, getting up and moving around more at work. Exercise will give the brain something else to do other than feeling anxiety/increased sensitivity to pain. Presently consider diagnosis of generalized anxiety disorder with somatic symptoms, may benefit from SSRI/SNRI or TCA. Trial Mobic now vs Ibuprofen to avoid multiple tims per day dosing which she has some difficulty with   Muscle ache - Plan: COMPLETE METABOLIC PANEL  WITH GFR, TSH, Hemoglobin A1c, VITAMIN D 25 Hydroxy (Vit-D Deficiency, Fractures), Magnesium, Urinalysis, Routine w reflex microscopic, CK  Abnormal urinalysis - Plan: Urinalysis, Routine w reflex microscopic  Fatigue, unspecified type  Stress      Visit summary with medication list and pertinent instructions was printed for patient to review. All questions at time of visit were answered - patient instructed to contact office with any additional concerns. ER/RTC precautions were reviewed with the patient. Follow-up plan: Return for ANNUAL PHYSICAL next few months .  Note: Total time spent 45 minutes, greater than 50% of the visit was  spent face-to-face counseling and coordinating care for the following: The primary encounter diagnosis was Muscle ache. Diagnoses of Abnormal urinalysis, Fatigue, unspecified type, and Stress were also pertinent to this visit.Marland Kitchen  Please note: voice recognition software was used to produce this document, and typos may escape review. Please contact Dr. Sheppard Coil for any needed clarifications.

## 2017-04-03 ENCOUNTER — Other Ambulatory Visit: Payer: Self-pay | Admitting: Osteopathic Medicine

## 2017-04-03 DIAGNOSIS — R17 Unspecified jaundice: Secondary | ICD-10-CM

## 2017-04-04 LAB — URINALYSIS, ROUTINE W REFLEX MICROSCOPIC
BILIRUBIN URINE: NEGATIVE
GLUCOSE, UA: NEGATIVE
HGB URINE DIPSTICK: NEGATIVE
KETONES UR: NEGATIVE
Leukocytes, UA: NEGATIVE
Nitrite: NEGATIVE
PH: 5.5 (ref 5.0–8.0)
PROTEIN: NEGATIVE
Specific Gravity, Urine: 1.008 (ref 1.001–1.03)

## 2017-04-04 LAB — COMPLETE METABOLIC PANEL WITH GFR
AG Ratio: 2.4 (calc) (ref 1.0–2.5)
ALBUMIN MSPROF: 4.8 g/dL (ref 3.6–5.1)
ALT: 8 U/L (ref 6–29)
AST: 11 U/L (ref 10–30)
Alkaline phosphatase (APISO): 31 U/L — ABNORMAL LOW (ref 33–115)
BILIRUBIN TOTAL: 2.4 mg/dL — AB (ref 0.2–1.2)
BUN: 12 mg/dL (ref 7–25)
CO2: 26 mmol/L (ref 20–32)
Calcium: 9.4 mg/dL (ref 8.6–10.2)
Chloride: 103 mmol/L (ref 98–110)
Creat: 0.66 mg/dL (ref 0.50–1.10)
GFR, EST AFRICAN AMERICAN: 125 mL/min/{1.73_m2} (ref >=60)
GFR, Est Non African American: 107 mL/min/{1.73_m2} (ref >=60)
GLOBULIN: 2 g/dL (ref 1.9–3.7)
Glucose, Bld: 88 mg/dL (ref 65–99)
Potassium: 4.3 mmol/L (ref 3.5–5.3)
Sodium: 138 mmol/L (ref 135–146)
TOTAL PROTEIN: 6.8 g/dL (ref 6.1–8.1)

## 2017-04-04 LAB — MAGNESIUM: MAGNESIUM: 2.1 mg/dL (ref 1.5–2.5)

## 2017-04-04 LAB — HEMOGLOBIN A1C
EAG (MMOL/L): 5.4 (calc)
HEMOGLOBIN A1C: 5 %{Hb} (ref <5.7)
Mean Plasma Glucose: 97 (calc)

## 2017-04-04 LAB — VITAMIN D 25 HYDROXY (VIT D DEFICIENCY, FRACTURES): VIT D 25 HYDROXY: 28 ng/mL — AB (ref 30–100)

## 2017-04-04 LAB — BILIRUBIN, FRACTIONATED(TOT/DIR/INDIR)
BILIRUBIN TOTAL: 2.4 mg/dL — AB (ref 0.2–1.2)
Bilirubin, Direct: 0.3 mg/dL — ABNORMAL HIGH (ref 0.0–0.2)
Indirect Bilirubin: 2.1 mg/dL (calc) — ABNORMAL HIGH (ref 0.2–1.2)

## 2017-04-04 LAB — CK: Total CK: 54 U/L (ref 29–143)

## 2017-04-04 LAB — TSH: TSH: 1.45 m[IU]/L

## 2017-04-06 ENCOUNTER — Other Ambulatory Visit: Payer: Self-pay

## 2017-04-06 ENCOUNTER — Other Ambulatory Visit (HOSPITAL_COMMUNITY)
Admission: RE | Admit: 2017-04-06 | Discharge: 2017-04-06 | Disposition: A | Discharge status: Home / Self Care | Payer: 59 | Source: Ambulatory Visit | Attending: Obstetrics and Gynecology | Admitting: Obstetrics and Gynecology

## 2017-04-06 ENCOUNTER — Encounter: Payer: Self-pay | Admitting: Obstetrics and Gynecology

## 2017-04-06 ENCOUNTER — Encounter: Payer: Self-pay | Admitting: Osteopathic Medicine

## 2017-04-06 ENCOUNTER — Ambulatory Visit (INDEPENDENT_AMBULATORY_CARE_PROVIDER_SITE_OTHER): Payer: 59 | Admitting: Obstetrics and Gynecology

## 2017-04-06 VITALS — BP 110/60 | HR 76 | Resp 16 | Ht 63.75 in | Wt 139.0 lb

## 2017-04-06 DIAGNOSIS — Z124 Encounter for screening for malignant neoplasm of cervix: Secondary | ICD-10-CM | POA: Insufficient documentation

## 2017-04-06 DIAGNOSIS — Z01419 Encounter for gynecological examination (general) (routine) without abnormal findings: Secondary | ICD-10-CM

## 2017-04-06 DIAGNOSIS — N644 Mastodynia: Secondary | ICD-10-CM

## 2017-04-06 DIAGNOSIS — Z8619 Personal history of other infectious and parasitic diseases: Secondary | ICD-10-CM

## 2017-04-06 DIAGNOSIS — N6321 Unspecified lump in the left breast, upper outer quadrant: Secondary | ICD-10-CM

## 2017-04-06 NOTE — Progress Notes (Signed)
46 y.o. G0P0000 MarriedCaucasianF here for annual exam.  Patient complains of right breast pain. She has a several year h/o right breast tenderness, doesn't come every month. Always in the same area. Can last for a couple of weeks at a time. This time it didn't go away after her last menses. The right breast has always been larger. Also tender on the left, but less so. She stopped drinking caffeine about a week ago (other than decaffeinated coffee). Cutting back on the caffeine has helped some. She also started her cycle and that helped her pain as well.  She has varicose veins. Some lower extremity pain, negative imaging for a clot. Started PT and that is helping. She does sit a lot at work.  She is married to a female.  Cycle have always been somewhat irregular. More regular in the last 2 years. Mostly 1 x a month.  She is under a lot of stress at work, partner changed jobs.  Period Cycle (Days): 28 Period Duration (Days): 3-4 Period Pattern: Regular Menstrual Flow: Moderate Menstrual Control: Thin pad, Maxi pad Menstrual Control Change Freq (Hours): 12 Dysmenorrhea: (!) Mild Dysmenorrhea Symptoms: Cramping  Patient's last menstrual period was 04/04/2017.          Sexually active: Yes.    The current method of family planning is female partner.    Exercising: No.  some walking Smoker:  no  Health Maintenance: Pap:  September 2015 per patient - Pap negative, + HPV -- see EPIC History of abnormal Pap:  yes MMG:  About 2013 per patient normal with [facility] Colonoscopy:  n/a BMD:   n/a TDaP:  Due for tdap Gardasil: never   reports that  has never smoked. she has never used smokeless tobacco. She reports that she drinks alcohol. She reports that she does not use drugs. She is a Freight forwarder. New job. Has a wife, no kids.   Past Medical History:  Diagnosis Date  . Genital warts     Past Surgical History:  Procedure Laterality Date  . ANTERIOR CRUCIATE LIGAMENT REPAIR  1989  . [place]   right  . VARICOSE VEIN SURGERY Right     Current Outpatient Medications  Medication Sig Dispense Refill  . CINNAMON PO Take daily by mouth.    Marland Kitchen ibuprofen (ADVIL,MOTRIN) 600 MG tablet Take 600 mg as needed by mouth.    . meloxicam (MOBIC) 15 MG tablet Take 0.5-1 tablets (7.5-15 mg total) daily by mouth. 30 tablet 0   No current facility-administered medications for this visit.     Family History  Problem Relation Age of Onset  . Alcohol abuse Father   . Diabetes Father   . Heart Problems Father   . Stroke Father   . Alcohol abuse Mother   . COPD Mother   . Cancer Maternal Grandmother        lung  . Heart disease Paternal Grandmother   . Heart disease Paternal Grandfather     Review of Systems  Constitutional:       Right breast pain  HENT: Negative.   Eyes: Negative.   Respiratory: Negative.   Cardiovascular: Negative.   Gastrointestinal: Negative.   Endocrine: Negative.   Genitourinary: Negative.   Musculoskeletal: Negative.   Skin: Negative.   Allergic/Immunologic: Negative.   Neurological: Negative.   Hematological: Negative.   Psychiatric/Behavioral: Negative.     Exam:   BP 110/60 (BP Location: Right Arm, Patient Position: Sitting, Cuff Size: Normal)  Pulse 76   Resp 16   Ht 5' 3.75" (1.619 m)   Wt 139 lb (63 kg)   LMP 04/04/2017   BMI 24.05 kg/m   Weight change: @WEIGHTCHANGE @ Height:   Height: 5' 3.75" (161.9 cm)  Ht Readings from Last 3 Encounters:  04/06/17 5' 3.75" (1.619 m)  04/01/17 5\' 4"  (1.626 m)  01/31/14 5\' 4"  (1.626 m)    General appearance: alert, cooperative and appears stated age Head: Normocephalic, without obvious abnormality, atraumatic Neck: no adenopathy, supple, symmetrical, trachea midline and thyroid normal to inspection and palpation Lungs: clear to auscultation bilaterally Cardiovascular: regular rate and rhythm Breasts: very tender with palpation of the outer portion of the right breast.  Bilateral fibrocystic changes, small pea sized mobile lump in the left breast at 2 o'clock.  Abdomen: soft, non-tender; non distended,  no masses,  no organomegaly Extremities: extremities normal, atraumatic, no cyanosis or edema Skin: Skin color, texture, turgor normal. No rashes or lesions Lymph nodes: Cervical, supraclavicular, and axillary nodes normal. No abnormal inguinal nodes palpated Neurologic: Grossly normal   Pelvic: External genitalia:  no lesions              Urethra:  normal appearing urethra with no masses, tenderness or lesions              Bartholins and Skenes: normal                 Vagina: normal appearing vagina with normal color and discharge, no lesions              Cervix: no lesions               Bimanual Exam:  Uterus:  normal size, contour, position, consistency, mobility, non-tender and retroverted              Adnexa: no mass, fullness, tenderness               Rectovaginal: Confirms               Anus:  normal sphincter tone, no lesions  Chaperone was present for exam.  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

## 2017-04-06 NOTE — Patient Instructions (Signed)
Breast Self-Awareness Breast self-awareness means being familiar with how your breasts look and feel. It involves checking your breasts regularly and reporting any changes to your health care provider. Practicing breast self-awareness is important. A change in your breasts can be a sign of a serious medical problem. Being familiar with how your breasts look and feel allows you to find any problems early, when treatment is more likely to be successful. All women should practice breast self-awareness, including women who have had breast implants. How to do a breast self-exam One way to learn what is normal for your breasts and whether your breasts are changing is to do a breast self-exam. To do a breast self-exam: Look for Changes  1. Remove all the clothing above your waist. 2. Stand in front of a mirror in a room with good lighting. 3. Put your hands on your hips. 4. Push your hands firmly downward. 5. Compare your breasts in the mirror. Look for differences between them (asymmetry), such as: ? Differences in shape. ? Differences in size. ? Puckers, dips, and bumps in one breast and not the other. 6. Look at each breast for changes in your skin, such as: ? Redness. ? Scaly areas. 7. Look for changes in your nipples, such as: ? Discharge. ? Bleeding. ? Dimpling. ? Redness. ? A change in position. Feel for Changes  Carefully feel your breasts for lumps and changes. It is best to do this while lying on your back on the floor and again while sitting or standing in the shower or tub with soapy water on your skin. Feel each breast in the following way:  Place the arm on the side of the breast you are examining above your head.  Feel your breast with the other hand.  Start in the nipple area and make  inch (2 cm) overlapping circles to feel your breast. Use the pads of your three middle fingers to do this. Apply light pressure, then medium pressure, then firm pressure. The light pressure  will allow you to feel the tissue closest to the skin. The medium pressure will allow you to feel the tissue that is a little deeper. The firm pressure will allow you to feel the tissue close to the ribs.  Continue the overlapping circles, moving downward over the breast until you feel your ribs below your breast.  Move one finger-width toward the center of the body. Continue to use the  inch (2 cm) overlapping circles to feel your breast as you move slowly up toward your collarbone.  Continue the up and down exam using all three pressures until you reach your armpit.  Write Down What You Find  Write down what is normal for each breast and any changes that you find. Keep a written record with breast changes or normal findings for each breast. By writing this information down, you do not need to depend only on memory for size, tenderness, or location. Write down where you are in your menstrual cycle, if you are still menstruating. If you are having trouble noticing differences in your breasts, do not get discouraged. With time you will become more familiar with the variations in your breasts and more comfortable with the exam. How often should I examine my breasts? Examine your breasts every month. If you are breastfeeding, the best time to examine your breasts is after a feeding or after using a breast pump. If you menstruate, the best time to examine your breasts is 5-7 days after your  period is over. During your period, your breasts are lumpier, and it may be more difficult to notice changes. When should I see my health care provider? See your health care provider if you notice:  A change in shape or size of your breasts or nipples.  A change in the skin of your breast or nipples, such as a reddened or scaly area.  Unusual discharge from your nipples.  A lump or thick area that was not there before.  Pain in your breasts.  Anything that concerns you.  This information is not intended  to replace advice given to you by your health care provider. Make sure you discuss any questions you have with your health care provider. Document Released: 05/05/2005 Document Revised: 10/11/2015 Document Reviewed: 03/25/2015 Elsevier Interactive Patient Education  2018 [place] AND DIET:  We recommended that you start or continue a regular exercise program for good health. Regular exercise means any activity that makes your heart beat faster and makes you sweat.  We recommend exercising at least 30 minutes per day at least 3 days a week, preferably 4 or 5.  We also recommend a diet low in fat and sugar.  Inactivity, poor dietary choices and obesity can cause diabetes, heart attack, stroke, and kidney damage, among others.    ALCOHOL AND SMOKING:  Women should limit their alcohol intake to no more than 7 drinks/beers/glasses of wine (combined, not each!) per week. Moderation of alcohol intake to this level decreases your risk of breast cancer and liver damage. And of course, no recreational drugs are part of a healthy lifestyle.  And absolutely no smoking or even second hand smoke. Most people know smoking can cause heart and lung diseases, but did you know it also contributes to weakening of your bones? Aging of your skin?  Yellowing of your teeth and nails?  CALCIUM AND VITAMIN D:  Adequate intake of calcium and Vitamin D are recommended.  The recommendations for exact amounts of these supplements seem to change often, but generally speaking 600 mg of calcium (either carbonate or citrate) and 800 units of Vitamin D per day seems prudent. Certain women may benefit from higher intake of Vitamin D.  If you are among these women, your doctor will have told you during your visit.    PAP SMEARS:  Pap smears, to check for cervical cancer or precancers,  have traditionally been done yearly, although recent scientific advances have shown that most women can have pap smears less often.  However,  every woman still should have a physical exam from her gynecologist every year. It will include a breast check, inspection of the vulva and vagina to check for abnormal growths or skin changes, a visual exam of the cervix, and then an exam to evaluate the size and shape of the uterus and ovaries.  And after 45 years of age, a rectal exam is indicated to check for rectal cancers. We will also provide age appropriate advice regarding health maintenance, like when you should have certain vaccines, screening for sexually transmitted diseases, bone density testing, colonoscopy, mammograms, etc.   MAMMOGRAMS:  All women over 36 years old should have a yearly mammogram. Many facilities now offer a "3D" mammogram, which may cost around $50 extra out of pocket. If possible,  we recommend you accept the option to have the 3D mammogram performed.  It both reduces the number of women who will be called back for extra views which then turn out to  be normal, and it is better than the routine mammogram at detecting truly abnormal areas.    COLONOSCOPY:  Colonoscopy to screen for colon cancer is recommended for all women at age 63.  We know, you hate the idea of the prep.  We agree, BUT, having colon cancer and not knowing it is worse!!  Colon cancer so often starts as a polyp that can be seen and removed at colonscopy, which can quite literally save your life!  And if your first colonoscopy is normal and you have no family history of colon cancer, most women don't have to have it again for 10 years.  Once every ten years, you can do something that may end up saving your life, right?  We will be happy to help you get it scheduled when you are ready.  Be sure to check your insurance coverage so you understand how much it will cost.  It may be covered as a preventative service at no cost, but you should check your particular policy.

## 2017-04-06 NOTE — Progress Notes (Signed)
Patient scheduled while in office for bilateral diagnostic MMG and [place] if needed. Spoke with [employer] at [facility]. Patient scheduled for 04/14/17 at 9:15am. Patient scheduled for 6wk breast recheck on 05/20/17 at 8:15am with Dr. Talbert Nan. Patient verbalizes understanding and is agreeable. Order faxed to [facility].

## 2017-04-07 LAB — CYTOLOGY - PAP
Diagnosis: NEGATIVE
HPV: DETECTED — AB

## 2017-04-14 ENCOUNTER — Other Ambulatory Visit: Payer: Self-pay | Admitting: Radiology

## 2017-04-14 DIAGNOSIS — N6011 Diffuse cystic mastopathy of right breast: Secondary | ICD-10-CM | POA: Diagnosis not present

## 2017-04-14 DIAGNOSIS — N6321 Unspecified lump in the left breast, upper outer quadrant: Secondary | ICD-10-CM | POA: Diagnosis not present

## 2017-04-14 DIAGNOSIS — D242 Benign neoplasm of left breast: Secondary | ICD-10-CM | POA: Diagnosis not present

## 2017-04-14 DIAGNOSIS — R921 Mammographic calcification found on diagnostic imaging of breast: Secondary | ICD-10-CM | POA: Diagnosis not present

## 2017-04-14 DIAGNOSIS — N6001 Solitary cyst of right breast: Secondary | ICD-10-CM | POA: Diagnosis not present

## 2017-04-14 DIAGNOSIS — N644 Mastodynia: Secondary | ICD-10-CM | POA: Diagnosis not present

## 2017-04-18 HISTORY — PX: BREAST SURGERY: SHX581

## 2017-04-21 ENCOUNTER — Other Ambulatory Visit: Payer: Self-pay

## 2017-04-21 DIAGNOSIS — B977 Papillomavirus as the cause of diseases classified elsewhere: Secondary | ICD-10-CM

## 2017-04-22 ENCOUNTER — Other Ambulatory Visit: Payer: Self-pay

## 2017-04-22 ENCOUNTER — Ambulatory Visit (INDEPENDENT_AMBULATORY_CARE_PROVIDER_SITE_OTHER): Payer: 59 | Admitting: Obstetrics and Gynecology

## 2017-04-22 ENCOUNTER — Encounter: Payer: Self-pay | Admitting: Obstetrics and Gynecology

## 2017-04-22 VITALS — BP 110/70 | HR 72 | Resp 16 | Ht 63.75 in | Wt 143.0 lb

## 2017-04-22 DIAGNOSIS — N870 Mild cervical dysplasia: Secondary | ICD-10-CM | POA: Diagnosis not present

## 2017-04-22 DIAGNOSIS — B977 Papillomavirus as the cause of diseases classified elsewhere: Secondary | ICD-10-CM

## 2017-04-22 DIAGNOSIS — Z01812 Encounter for preprocedural laboratory examination: Secondary | ICD-10-CM

## 2017-04-22 DIAGNOSIS — Z124 Encounter for screening for malignant neoplasm of cervix: Secondary | ICD-10-CM

## 2017-04-22 LAB — POCT URINE PREGNANCY: Preg Test, Ur: NEGATIVE

## 2017-04-22 NOTE — Progress Notes (Signed)
GYNECOLOGY  VISIT   HPI: 46 y.o.   Married  Caucasian  female   G0P0000 with Patient's last menstrual period was 04/05/2017.   here for Colposcopy. Recent pap was negative, but +HPV. Last prior pap in 2015 was negative with +HPV. The patient has only been sexually active with men on 4 different encounters in her life. She is married to a woman. She has lots of questions about hpv.   GYNECOLOGIC HISTORY: Patient's last menstrual period was 04/05/2017. Contraception: none. Female partner  Menopausal hormone therapy: none        OB History    Gravida Para Term Preterm AB Living   0 0 0 0 0 0   SAB TAB Ectopic Multiple Live Births   0 0 0 0 0         Patient Active Problem List   Diagnosis Date Noted  . VARICOSE VEINS, LOWER EXTREMITIES, WITH INFLAMMATION 09/19/2009    Past Medical History:  Diagnosis Date  . Genital warts     Past Surgical History:  Procedure Laterality Date  . ANTERIOR CRUCIATE LIGAMENT REPAIR  1989  . [place]   right  . VARICOSE VEIN SURGERY Right     Current Outpatient Medications  Medication Sig Dispense Refill  . b complex vitamins tablet Take 1 tablet by mouth daily.    Marland Kitchen CINNAMON PO Take daily by mouth.    . Evening Primrose Oil 1000 MG CAPS Take by mouth.    Marland Kitchen ibuprofen (ADVIL,MOTRIN) 200 MG tablet Take 200 mg by mouth every 6 (six) hours as needed.    . vitamin E (VITAMIN E) 400 UNIT capsule Take 400 Units by mouth daily.     No current facility-administered medications for this visit.      ALLERGIES: Patient has no known allergies.  Family History  Problem Relation Age of Onset  . Alcohol abuse Father   . Diabetes Father   . Heart Problems Father   . Stroke Father   . Alcohol abuse Mother   . COPD Mother   . Cancer Maternal Grandmother        lung  . Heart disease Paternal Grandmother   . Heart disease Paternal Grandfather     Social History   Socioeconomic History  . Marital status: Married    Spouse name: Anderson Malta  . Number of children: Not on file  . Years of education: Not on file  . Highest education level: Not on file  Social Needs  . Financial resource strain: Not on file  . Food insecurity - worry: Not on file  . Food insecurity - inability: Not on file  . Transportation needs - medical: Not on file  . Transportation needs - non-medical: Not on file  Occupational History  . Not on file  Tobacco Use  . Smoking status: Never Smoker  . Smokeless tobacco: Never Used  Substance and Sexual Activity  . Alcohol use: Yes    Comment: occasion glass of wine  . Drug use: No  . Sexual activity: Yes    Partners: Female    Birth control/protection: None    Comment: female partner  Other Topics Concern  . Not on file  Social History Narrative   No exercise, fair diet. Weight stable. She is married to female.    Review of Systems  Constitutional: Negative.   HENT: Negative.   Eyes: Negative.   Respiratory: Negative.   Cardiovascular: Negative.   Gastrointestinal: Negative.  Genitourinary: Negative.   Musculoskeletal: Negative.   Skin: Negative.   Neurological: Negative.   Endo/Heme/Allergies: Negative.   Psychiatric/Behavioral: Negative.     PHYSICAL EXAMINATION:    BP 110/70 (BP Location: Right Arm, Patient Position: Sitting, Cuff Size: Normal)   Pulse 72   Resp 16   Ht 5' 3.75" (1.619 m)   Wt 143 lb (64.9 kg)   LMP 04/05/2017   BMI 24.74 kg/m     General appearance: alert, cooperative and appears stated age  Pelvic: External genitalia:  no lesions              Urethra:  normal appearing urethra with no masses, tenderness or lesions              Bartholins and Skenes: normal                 Vagina: normal appearing vagina with normal color and discharge, no lesions              Cervix: no lesions  Colposcopy: unsatisfactory, no aceto-white changes, negative lugols examination of the cervix and upper vagina. ECC done  Needed to use pediatric  speculum  Chaperone was present for exam.  ASSESSMENT Persistently + HPV     PLAN Colposcopy of cervix and upper vagina, ECC done Discussed HPV, the new guidelines for the HPV vaccine, discussed transmission of HPV All of her questions were answered Information on the gardasil vaccination were given, she will let me know if she wants to start it She has a f/u breast check in January (recent biopsy of a complex cyst)   An After Visit Summary was printed and given to the patient.  In addition to the colposcopy, 15 minutes face to face time of which over 50% was spent in counseling.

## 2017-04-22 NOTE — Patient Instructions (Signed)

## 2017-05-01 ENCOUNTER — Encounter: Payer: Self-pay | Admitting: Osteopathic Medicine

## 2017-05-08 ENCOUNTER — Encounter: Payer: Self-pay | Admitting: Obstetrics and Gynecology

## 2017-05-20 ENCOUNTER — Other Ambulatory Visit: Payer: Self-pay

## 2017-05-20 ENCOUNTER — Encounter: Payer: Self-pay | Admitting: Obstetrics and Gynecology

## 2017-05-20 ENCOUNTER — Ambulatory Visit (INDEPENDENT_AMBULATORY_CARE_PROVIDER_SITE_OTHER): Payer: 59 | Admitting: Obstetrics and Gynecology

## 2017-05-20 VITALS — BP 102/60 | HR 72 | Temp 99.1°F | Resp 16 | Wt 144.0 lb

## 2017-05-20 DIAGNOSIS — N6011 Diffuse cystic mastopathy of right breast: Secondary | ICD-10-CM

## 2017-05-20 DIAGNOSIS — N6012 Diffuse cystic mastopathy of left breast: Secondary | ICD-10-CM

## 2017-05-20 DIAGNOSIS — Z87410 Personal history of cervical dysplasia: Secondary | ICD-10-CM | POA: Diagnosis not present

## 2017-05-20 DIAGNOSIS — N6321 Unspecified lump in the left breast, upper outer quadrant: Secondary | ICD-10-CM | POA: Diagnosis not present

## 2017-05-20 DIAGNOSIS — N923 Ovulation bleeding: Secondary | ICD-10-CM | POA: Diagnosis not present

## 2017-05-20 NOTE — Progress Notes (Signed)
GYNECOLOGY  VISIT   HPI: 47 y.o.   Married  Caucasian  female   G0P0000 with Patient's last menstrual period was 05/01/2017.here for 6 week recheck; patient complains of having breakthrough bleeding. Patient currently has a cold with low-grade fever. At her annual exam in 11/18 she c/o mastalgia, worse on the right. She was noted to have a small left breast lump at that time. Imaging revealed cysts on the right, including a complicated cyst (it was aspirated), she was also noted to have a mass on the left, biopsy was recommended. She was told the biopsy on the left was benign. Is due for in May.  Her mastalgia has improved with evening primrose oil. She hasn't noticed anything different on exam.  She has had some break through bleeding in the last 2 months. She spotted a few day s prior to her cycle in December and spotted again for a day or 2 about mid cycle. She was under stress during this time.    GYNECOLOGIC HISTORY: Patient's last menstrual period was 05/01/2017. Contraception:female partner Menopausal hormone therapy: none        OB History    Gravida Para Term Preterm AB Living   0 0 0 0 0 0   SAB TAB Ectopic Multiple Live Births   0 0 0 0 0         Patient Active Problem List   Diagnosis Date Noted  . VARICOSE VEINS, LOWER EXTREMITIES, WITH INFLAMMATION 09/19/2009    Past Medical History:  Diagnosis Date  . Genital warts     Past Surgical History:  Procedure Laterality Date  . ANTERIOR CRUCIATE LIGAMENT REPAIR  1989  . [place]   right  . VARICOSE VEIN SURGERY Right     Current Outpatient Medications  Medication Sig Dispense Refill  . acetaminophen (TYLENOL) 325 MG tablet Take 650 mg by mouth as needed.    Marland Kitchen b complex vitamins tablet Take 1 tablet by mouth daily.    . cholecalciferol (VITAMIN D) 400 units TABS tablet Take 400 Units by mouth.    . Evening Primrose Oil 1000 MG CAPS Take by mouth.    Marland Kitchen ibuprofen (ADVIL,MOTRIN) 200 MG  tablet Take 200 mg by mouth every 6 (six) hours as needed.    . vitamin E (VITAMIN E) 400 UNIT capsule Take 400 Units by mouth daily.    Marland Kitchen CINNAMON PO Take daily by mouth.     No current facility-administered medications for this visit.      ALLERGIES: Patient has no known allergies.  Family History  Problem Relation Age of Onset  . Alcohol abuse Father   . Diabetes Father   . Heart Problems Father   . Stroke Father   . Alcohol abuse Mother   . COPD Mother   . Cancer Maternal Grandmother        lung  . Heart disease Paternal Grandmother   . Heart disease Paternal Grandfather     Social History   Socioeconomic History  . Marital status: Married    Spouse name: Anderson Malta  . Number of children: Not on file  . Years of education: Not on file  . Highest education level: Not on file  Social Needs  . Financial resource strain: Not on file  . Food insecurity - worry: Not on file  . Food insecurity - inability: Not on file  . Transportation needs - medical: Not on file  . Transportation needs - non-medical: Not  on file  Occupational History  . Not on file  Tobacco Use  . Smoking status: Never Smoker  . Smokeless tobacco: Never Used  Substance and Sexual Activity  . Alcohol use: Yes    Comment: occasion glass of wine  . Drug use: No  . Sexual activity: Yes    Partners: Female    Birth control/protection: None    Comment: female partner  Other Topics Concern  . Not on file  Social History Narrative   No exercise, fair diet. Weight stable. She is married to female.    Review of Systems  Constitutional: Positive for fever.  HENT: Positive for congestion.        Sneezing   Eyes: Negative.   Respiratory: Negative.   Cardiovascular: Negative.   Gastrointestinal: Negative.   Genitourinary:       Menstrual cycle changes  Musculoskeletal: Negative.   Skin: Negative.   Neurological: Negative.   Endo/Heme/Allergies: Negative.   Psychiatric/Behavioral: Negative.      PHYSICAL EXAMINATION:    BP 102/60 (BP Location: Right Arm, Patient Position: Sitting, Cuff Size: Normal)   Pulse 72   Resp 16   Wt 144 lb (65.3 kg)   LMP 05/01/2017   BMI 24.91 kg/m     General appearance: alert, cooperative and appears stated age Breasts: bilateral fibrocystic changes in the upper outer quadrant of each breast R>L, stable pea sized lump in the left breast near the periphery at 1-2 o'clock. Tender bilaterally in the upper outer quadrants.  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  ~25 minutes face to face time of which over 50% was spent in counseling.

## 2017-07-10 ENCOUNTER — Ambulatory Visit (INDEPENDENT_AMBULATORY_CARE_PROVIDER_SITE_OTHER): Payer: 59 | Admitting: Osteopathic Medicine

## 2017-07-10 ENCOUNTER — Encounter: Payer: Self-pay | Admitting: Osteopathic Medicine

## 2017-07-10 VITALS — BP 95/61 | HR 64 | Temp 98.3°F | Wt 146.0 lb

## 2017-07-10 DIAGNOSIS — R17 Unspecified jaundice: Secondary | ICD-10-CM

## 2017-07-10 DIAGNOSIS — M791 Myalgia, unspecified site: Secondary | ICD-10-CM | POA: Insufficient documentation

## 2017-07-10 DIAGNOSIS — E559 Vitamin D deficiency, unspecified: Secondary | ICD-10-CM | POA: Diagnosis not present

## 2017-07-10 DIAGNOSIS — R5383 Other fatigue: Secondary | ICD-10-CM | POA: Diagnosis not present

## 2017-07-10 HISTORY — DX: Vitamin D deficiency, unspecified: E55.9

## 2017-07-10 HISTORY — DX: Unspecified jaundice: R17

## 2017-07-10 NOTE — Progress Notes (Signed)
HPI: Sue Lopez is a 47 y.o. female who  has a past medical history of Elevated bilirubin (07/10/2017), Fatigue (07/10/2017), Genital warts, Muscle ache (07/10/2017), and Vitamin D deficiency (07/10/2017).  she presents to [facility] today, 07/10/17,  for chief complaint of:  Feeling unwell    Leg pain/sensitivity - persistent. Describes as tenderness/increased in severity above medial knee/thigh little bit worse on the right side where she previously had a surgery for varicose vein but now pain seems to be going up toward the groin a bit more.  Occasional low back/tailbone pain from previous injury. 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 Reports a lot of increased stress at work, she and spouse recently both got different jobs which has been significantly stressful. Reports tiredness, anxiety, breast sensitivity for which she is following with OB/GYN.   Given persistent symptoms, she has reasonable concerns about possible undiagnosed organic issue, though she is realistic about the possibility of depression/anxiety causing somatic symptoms or increased pain sensitivity. Lab workup was essentially negative except for slightly decreased vitamin D, elevated bilirubin but without previous records for comparison. Basic inflammatory markers were negative at that time.     Past medical, surgical, social and family history reviewed:  Patient Active Problem List   Diagnosis Date Noted  . VARICOSE VEINS, LOWER EXTREMITIES, WITH INFLAMMATION 09/19/2009    Past Surgical History:  Procedure Laterality Date  . ANTERIOR CRUCIATE LIGAMENT REPAIR  1989  . [place]   right  . VARICOSE VEIN SURGERY Right     Social History   Tobacco Use  . Smoking status: Never Smoker  . Smokeless tobacco: Never  Used  Substance Use Topics  . Alcohol use: Yes    Comment: occasion glass of wine    Family History  Problem Relation Age of Onset  . Alcohol abuse Father   . Diabetes Father   . Heart Problems Father   . Stroke Father   . Alcohol abuse Mother   . COPD Mother   . Cancer Maternal Grandmother        lung  . Heart disease Paternal Grandmother   . Heart disease Paternal Grandfather      Current medication list and allergy/intolerance information reviewed:    Current Outpatient Medications  Medication Sig Dispense Refill  . acetaminophen (TYLENOL) 325 MG tablet Take 650 mg by mouth as needed.    Marland Kitchen b complex vitamins tablet Take 1 tablet by mouth daily.    . cholecalciferol (VITAMIN D) 400 units TABS tablet Take 400 Units by mouth.    Marland Kitchen CINNAMON PO Take daily by mouth.    . Evening Primrose Oil 1000 MG CAPS Take by mouth.    Marland Kitchen ibuprofen (ADVIL,MOTRIN) 200 MG tablet Take 200 mg by mouth every 6 (six) hours as needed.    . vitamin E (VITAMIN E) 400 UNIT capsule Take 400 Units by mouth daily.     No current facility-administered medications for this visit.     No Known Allergies    Review of Systems:  Constitutional:  No  fever, no chills, No recent illness, No unintentional weight changes. +significant fatigue.   HEENT: No  headache, no vision change  Cardiac: No  chest pain, No  pressure, No palpitations, No  Orthopnea  Respiratory:  No  shortness  of breath. No  Cough  Gastrointestinal: No  abdominal pain, No  nausea, No  vomiting,  No  blood in stool, No  diarrhea, No  constipation   Musculoskeletal: +myalgia/arthralgia  Skin: No  Rash, No other wounds/concerning lesions  Hem/Onc: No  easy bruising/bleeding, No  abnormal lymph node  Endocrine: No cold intolerance,  No heat intolerance. No polyuria/polydipsia/polyphagia   Neurologic: No  weakness, No  dizziness,  Psychiatric: No  concerns with depression, +concerns with anxiety, +sleep problems, No mood  problems  Exam:  BP 95/61   Pulse 64   Temp 98.3 F (36.8 C) (Oral)   Wt 146 lb 0.6 oz (66.2 kg)   LMP 07/01/2017   BMI 25.26 kg/m   Constitutional: VS see above. General Appearance: alert, well-developed, well-nourished, NAD  Eyes: Normal lids and conjunctive, non-icteric sclera  Neck: No masses, trachea midline.   Respiratory: Normal respiratory effort. no wheeze, no rhonchi, no rales  Cardiovascular: S1/S2 normal, no murmur, no rub/gallop auscultated. RRR. No lower extremity edema.   Gastrointestinal: Nontender, no masses. No hepatomegaly, no splenomegaly. No hernia appreciated. Bowel sounds normal. Rectal exam deferred.   Musculoskeletal: Gait normal. No clubbing/cyanosis of digits. Homans sign negative bilaterally. No lower extremity edema. Normal range of motion hips and knees,  Neurological: Normal balance/coordination. No tremor.  Skin: warm, dry, intact. No rash/ulcer.     Psychiatric: Normal judgment/insight. Normal mood and affect. Oriented x3.     ASSESSMENT/PLAN: Discussion about need for rule out abnormal causes though this really doesn't fit any symptoms of rheumatologic issue, we'll go ahead and get a few additional labs. Advised continue her exercise program and low-gluten diet, stretching, yoga for low back pain, getting up and moving around more at work.  Consider diagnosis of generalized anxiety disorder with somatic symptoms, may benefit from SSRI/SNRI or TCA, consider FM, consider rheum d/o, consider 2nd opinion from sports medicine before pursuing psychiatric   Muscle ache - Plan: CBC, COMPLETE METABOLIC PANEL WITH GFR, TSH, VITAMIN D 25 Hydroxy (Vit-D Deficiency, Fractures), ANA, CK, High sensitivity CRP, Rheumatoid factor, Sedimentation rate, Bilirubin, fractionated (tot/dir/indir), Food Allergy Profile, ANA, CANCELED: Lipid panel  Fatigue, unspecified type - Plan: CBC, COMPLETE METABOLIC PANEL WITH GFR, TSH, VITAMIN D 25 Hydroxy (Vit-D Deficiency,  Fractures), ANA, CK, High sensitivity CRP, Rheumatoid factor, Sedimentation rate, Bilirubin, fractionated (tot/dir/indir), Food Allergy Profile, ANA, CANCELED: Lipid panel  Elevated bilirubin - Plan: Bilirubin, fractionated (tot/dir/indir)  Vitamin D deficiency - Plan: VITAMIN D 25 Hydroxy (Vit-D Deficiency, Fractures)      Visit summary with medication list and pertinent instructions was printed for patient to review. All questions at time of visit were answered - patient instructed to contact office with any additional concerns. ER/RTC precautions were reviewed with the patient. Follow-up plan: Return for recheck with sports medicine if labs ok. .  Note: Total time spent 40 minutes, greater than 50% of the visit was spent face-to-face counseling and coordinating care for the following: The primary encounter diagnosis was Muscle ache. Diagnoses of Fatigue, unspecified type, Elevated bilirubin, and Vitamin D deficiency were also pertinent to this visit.Marland Kitchen  Please note: voice recognition software was used to produce this document, and typos may escape review. Please contact Dr. Sheppard Coil for any needed clarifications.

## 2017-07-13 LAB — COMPLETE METABOLIC PANEL WITH GFR
AG RATIO: 2.2 (calc) (ref 1.0–2.5)
ALT: 11 U/L (ref 6–29)
AST: 12 U/L (ref 10–35)
Albumin: 4.7 g/dL (ref 3.6–5.1)
Alkaline phosphatase (APISO): 31 U/L — ABNORMAL LOW (ref 33–115)
BILIRUBIN TOTAL: 1.8 mg/dL — AB (ref 0.2–1.2)
BUN: 12 mg/dL (ref 7–25)
CHLORIDE: 102 mmol/L (ref 98–110)
CO2: 28 mmol/L (ref 20–32)
Calcium: 9.7 mg/dL (ref 8.6–10.2)
Creat: 0.82 mg/dL (ref 0.50–1.10)
GFR, EST AFRICAN AMERICAN: 100 mL/min/{1.73_m2} (ref >=60)
GFR, Est Non African American: 86 mL/min/{1.73_m2} (ref >=60)
GLOBULIN: 2.1 g/dL (ref 1.9–3.7)
Glucose, Bld: 88 mg/dL (ref 65–99)
POTASSIUM: 4.3 mmol/L (ref 3.5–5.3)
SODIUM: 139 mmol/L (ref 135–146)
Total Protein: 6.8 g/dL (ref 6.1–8.1)

## 2017-07-13 LAB — CBC
HCT: 41.1 % (ref 35.0–45.0)
Hemoglobin: 13.7 g/dL (ref 11.7–15.5)
MCH: 30.2 pg (ref 27.0–33.0)
MCHC: 33.3 g/dL (ref 32.0–36.0)
MCV: 90.5 fL (ref 80.0–100.0)
MPV: 11.6 fL (ref 7.5–12.5)
PLATELETS: 258 10*3/uL (ref 140–400)
RBC: 4.54 10*6/uL (ref 3.80–5.10)
RDW: 11.8 % (ref 11.0–15.0)
WBC: 5.3 10*3/uL (ref 3.8–10.8)

## 2017-07-13 LAB — FOOD ALLERGY PROFILE
Allergen, Salmon, f41: <0.1 kU/L
Almonds: <0.1 kU/L
CLASS: 0
CLASS: 0
CLASS: 0
CLASS: 0
CLASS: 0
CLASS: 0
CLASS: 0
CLASS: 0
CLASS: 0
CLASS: 0
CLASS: 0
CLASS: 0
CLASS: 0
Class: 0
Class: 0
Egg White IgE: <0.1 kU/L
Hazelnut: <0.1 kU/L
Milk IgE: <0.1 kU/L
Peanut IgE: <0.1 kU/L
Scallop IgE: <0.1 kU/L
Sesame Seed f10: <0.1 kU/L
Soybean IgE: <0.1 kU/L
Tuna IgE: <0.1 kU/L
Walnut: <0.1 kU/L

## 2017-07-13 LAB — BILIRUBIN, FRACTIONATED(TOT/DIR/INDIR)
BILIRUBIN DIRECT: 0.3 mg/dL — AB (ref 0.0–0.2)
Indirect Bilirubin: 1.5 mg/dL (calc) — ABNORMAL HIGH (ref 0.2–1.2)
Total Bilirubin: 1.8 mg/dL — ABNORMAL HIGH (ref 0.2–1.2)

## 2017-07-13 LAB — LIPID PANEL
Cholesterol: 216 mg/dL — ABNORMAL HIGH (ref <200)
HDL: 70 mg/dL (ref >50)
LDL Cholesterol (Calc): 132 mg/dL (calc) — ABNORMAL HIGH
NON-HDL CHOLESTEROL (CALC): 146 mg/dL — AB (ref <130)
Total CHOL/HDL Ratio: 3.1 (calc) (ref <5.0)
Triglycerides: 46 mg/dL (ref <150)

## 2017-07-13 LAB — HIGH SENSITIVITY CRP

## 2017-07-13 LAB — SEDIMENTATION RATE: Sed Rate: 6 mm/h (ref 0–20)

## 2017-07-13 LAB — TSH: TSH: 1.13 mIU/L

## 2017-07-13 LAB — VITAMIN D 25 HYDROXY (VIT D DEFICIENCY, FRACTURES): VIT D 25 HYDROXY: 34 ng/mL (ref 30–100)

## 2017-07-13 LAB — CK: CK TOTAL: 43 U/L (ref 29–143)

## 2017-07-13 LAB — RHEUMATOID FACTOR

## 2017-07-13 LAB — INTERPRETATION:

## 2017-07-13 LAB — ANA: Anti Nuclear Antibody(ANA): NEGATIVE

## 2017-10-05 ENCOUNTER — Telehealth: Payer: Self-pay | Admitting: Obstetrics and Gynecology

## 2017-10-05 NOTE — Telephone Encounter (Signed)
Patient is due for 6 month follow up left breast ultrasound from left breast biopsy performed on 04/14/17. Order to [facility] for review and signature before faxing.

## 2017-10-05 NOTE — Telephone Encounter (Signed)
Patient stated that [facility] called her to inform her that she needed to get a referral from [facility] for a follow up from December.

## 2017-10-13 DIAGNOSIS — D242 Benign neoplasm of left breast: Secondary | ICD-10-CM | POA: Diagnosis not present

## 2017-11-05 NOTE — Telephone Encounter (Signed)
OK to close

## 2017-11-09 NOTE — Telephone Encounter (Signed)
Patient had follow up left breast ultrasound on 10/13/2017. Results were normal. Report sent to scan.

## 2017-12-12 DIAGNOSIS — L255 Unspecified contact dermatitis due to plants, except food: Secondary | ICD-10-CM | POA: Diagnosis not present

## 2017-12-12 DIAGNOSIS — L03119 Cellulitis of unspecified part of limb: Secondary | ICD-10-CM | POA: Diagnosis not present

## 2018-04-19 DIAGNOSIS — Z1231 Encounter for screening mammogram for malignant neoplasm of breast: Secondary | ICD-10-CM | POA: Diagnosis not present

## 2018-04-19 NOTE — Progress Notes (Signed)
47 y.o. G0P0000 Married White or Caucasian Not Hispanic or Latino female here for annual exam. Married to a Woman. No dyspareunia.  She has continued mastalgia, no change. Breast pain is worse on the right, cyclic. Wants to continue with caffeine. Not great at taking supplements.  Just doesn't feel quite right. She has breast pain, pain in her inner legs, lower back pain (Chiropractor helps), feels down some.     Period Cycle (Days): 28 Period Duration (Days): 4 days Period Pattern: Regular Menstrual Flow: Light, Moderate Menstrual Control: Thin pad Menstrual Control Change Freq (Hours): changes pad every 8 hours Dysmenorrhea: None  Occasional mid cycle spotting.   Patient's last menstrual period was 03/27/2018 (exact date).          Sexually active: Yes.    The current method of family planning is none.    Exercising: No.  The patient does not participate in regular exercise at present. Smoker:  No  Health Maintenance: Pap:  04/06/2017 neg with positive HR HPV, colposcopy with CIN I 01/31/14 neg with positive HR HPV History of abnormal Pap:  yes, colpo 05/02/2017 low grade dysplasia MMG:  10/13/2017 unilateral Birads 1 negative, just had a mammogram earlier this week.  Colonoscopy:  n/a BMD:   n/a TDaP:  11/16/2004 Gardasil: Never   reports that she has never smoked. She has never used smokeless tobacco. She reports that she drinks alcohol. She reports that she does not use drugs. She is a Freight forwarder.   Past Medical History:  Diagnosis Date  . Elevated bilirubin 07/10/2017  . Fatigue 07/10/2017  . Genital warts   . Muscle ache 07/10/2017  . Vitamin D deficiency 07/10/2017    Past Surgical History:  Procedure Laterality Date  . ANTERIOR CRUCIATE LIGAMENT REPAIR  1989  . [place]   right  . VARICOSE VEIN SURGERY Right     Current Outpatient Medications  Medication Sig Dispense Refill  . acetaminophen (TYLENOL) 325 MG tablet Take 650 mg by mouth as  needed.    Marland Kitchen ibuprofen (ADVIL,MOTRIN) 200 MG tablet Take 200 mg by mouth every 6 (six) hours as needed.     No current facility-administered medications for this visit.     Family History  Problem Relation Age of Onset  . Alcohol abuse Father   . Diabetes Father   . Heart Problems Father   . Stroke Father   . Alcohol abuse Mother   . COPD Mother   . Cancer Maternal Grandmother        lung  . Heart disease Paternal Grandmother   . Heart disease Paternal Grandfather     Review of Systems  Constitutional: Negative.   HENT: Negative.   Eyes: Negative.   Respiratory: Negative.   Cardiovascular: Negative.   Gastrointestinal: Negative.   Endocrine: Negative.   Genitourinary: Negative.        Breast pain  Musculoskeletal: Negative.   Skin: Negative.   Allergic/Immunologic: Negative.   Neurological: Negative.   Hematological: Negative.   Psychiatric/Behavioral: Negative.     Exam:   BP 104/60 (BP Location: Right Arm, Patient Position: Sitting, Cuff Size: Normal)   Pulse 64   Ht 5\' 5"  (1.651 m)   Wt 142 lb 6.4 oz (64.6 kg)   LMP 03/27/2018 (Exact Date)   BMI 23.70 kg/m   Weight change: @WEIGHTCHANGE @ Height:   Height: 5\' 5"  (165.1 cm)  Ht Readings from Last 3 Encounters:  04/22/18 5\' 5"  (1.651 m)  04/22/17 5'  3.75" (1.619 m)  04/06/17 5' 3.75" (1.619 m)    General appearance: alert, cooperative and appears stated age Head: Normocephalic, without obvious abnormality, atraumatic Neck: no adenopathy, supple, symmetrical, trachea midline and thyroid normal to inspection and palpation Lungs: clear to auscultation bilaterally Cardiovascular: regular rate and rhythm Breasts: bilateral fibrocystic changes, stable pea sized lump in the periphery of her left breast at 2 o'clock.  Abdomen: soft, non-tender; non distended,  no masses,  no organomegaly Extremities: extremities normal, atraumatic, no cyanosis or edema Skin: Skin color, texture, turgor normal. No rashes or  lesions Lymph nodes: Cervical, supraclavicular, and axillary nodes normal. No abnormal inguinal nodes palpated Neurologic: Grossly normal   Pelvic: External genitalia:  no lesions              Urethra:  normal appearing urethra with no masses, tenderness or lesions              Bartholins and Skenes: normal                 Vagina: normal appearing vagina with normal color and discharge, no lesions              Cervix: no lesions               Bimanual Exam:  Uterus:  normal size, contour, position, consistency, mobility, non-tender              Adnexa: no mass, fullness, tenderness               Rectovaginal: Confirms               Anus:  normal sphincter tone, no lesions  Chaperone was present for exam.  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

## 2018-04-22 ENCOUNTER — Other Ambulatory Visit: Payer: Self-pay

## 2018-04-22 ENCOUNTER — Ambulatory Visit (INDEPENDENT_AMBULATORY_CARE_PROVIDER_SITE_OTHER): Payer: 59 | Admitting: Obstetrics and Gynecology

## 2018-04-22 ENCOUNTER — Other Ambulatory Visit (HOSPITAL_COMMUNITY)
Admission: RE | Admit: 2018-04-22 | Discharge: 2018-04-22 | Disposition: A | Discharge status: Home / Self Care | Payer: 59 | Source: Ambulatory Visit | Attending: Obstetrics and Gynecology | Admitting: Obstetrics and Gynecology

## 2018-04-22 ENCOUNTER — Encounter: Payer: Self-pay | Admitting: Obstetrics and Gynecology

## 2018-04-22 VITALS — BP 104/60 | HR 64 | Ht 65.0 in | Wt 142.4 lb

## 2018-04-22 DIAGNOSIS — Z01419 Encounter for gynecological examination (general) (routine) without abnormal findings: Secondary | ICD-10-CM | POA: Diagnosis not present

## 2018-04-22 DIAGNOSIS — M791 Myalgia, unspecified site: Secondary | ICD-10-CM

## 2018-04-22 DIAGNOSIS — Z87410 Personal history of cervical dysplasia: Secondary | ICD-10-CM

## 2018-04-22 DIAGNOSIS — Z0000 Encounter for general adult medical examination without abnormal findings: Secondary | ICD-10-CM

## 2018-04-22 DIAGNOSIS — Z124 Encounter for screening for malignant neoplasm of cervix: Secondary | ICD-10-CM | POA: Insufficient documentation

## 2018-04-22 DIAGNOSIS — Z833 Family history of diabetes mellitus: Secondary | ICD-10-CM

## 2018-04-22 DIAGNOSIS — E559 Vitamin D deficiency, unspecified: Secondary | ICD-10-CM

## 2018-04-22 DIAGNOSIS — N644 Mastodynia: Secondary | ICD-10-CM

## 2018-04-22 DIAGNOSIS — R17 Unspecified jaundice: Secondary | ICD-10-CM

## 2018-04-22 NOTE — Patient Instructions (Signed)
To try and decrease your breast pain, you should have a well fitting supportive bra, cut back on caffeine, and use ice or heat as needed. Some women find relief with the supplement evening primrose oil.  EXERCISE AND DIET:  We recommended that you start or continue a regular exercise program for good health. Regular exercise means any activity that makes your heart beat faster and makes you sweat.  We recommend exercising at least 30 minutes per day at least 3 days a week, preferably 4 or 5.  We also recommend a diet low in fat and sugar.  Inactivity, poor dietary choices and obesity can cause diabetes, heart attack, stroke, and kidney damage, among others.    ALCOHOL AND SMOKING:  Women should limit their alcohol intake to no more than 7 drinks/beers/glasses of wine (combined, not each!) per week. Moderation of alcohol intake to this level decreases your risk of breast cancer and liver damage. And of course, no recreational drugs are part of a healthy lifestyle.  And absolutely no smoking or even second hand smoke. Most people know smoking can cause heart and lung diseases, but did you know it also contributes to weakening of your bones? Aging of your skin?  Yellowing of your teeth and nails?  CALCIUM AND VITAMIN D:  Adequate intake of calcium and Vitamin D are recommended.  The recommendations for exact amounts of these supplements seem to change often, but generally speaking 600 mg of calcium (either carbonate or citrate) and 800 units of Vitamin D per day seems prudent. Certain women may benefit from higher intake of Vitamin D.  If you are among these women, your doctor will have told you during your visit.    PAP SMEARS:  Pap smears, to check for cervical cancer or precancers,  have traditionally been done yearly, although recent scientific advances have shown that most women can have pap smears less often.  However, every woman still should have a physical exam from her gynecologist every year. It will  include a breast check, inspection of the vulva and vagina to check for abnormal growths or skin changes, a visual exam of the cervix, and then an exam to evaluate the size and shape of the uterus and ovaries.  And after 46 years of age, a rectal exam is indicated to check for rectal cancers. We will also provide age appropriate advice regarding health maintenance, like when you should have certain vaccines, screening for sexually transmitted diseases, bone density testing, colonoscopy, mammograms, etc.   MAMMOGRAMS:  All women over 46 years old should have a yearly mammogram. Many facilities now offer a "3D" mammogram, which may cost around $50 extra out of pocket. If possible,  we recommend you accept the option to have the 3D mammogram performed.  It both reduces the number of women who will be called back for extra views which then turn out to be normal, and it is better than the routine mammogram at detecting truly abnormal areas.    COLONOSCOPY:  Colonoscopy to screen for colon cancer is recommended for all women at age 21.  We know, you hate the idea of the prep.  We agree, BUT, having colon cancer and not knowing it is worse!!  Colon cancer so often starts as a polyp that can be seen and removed at colonscopy, which can quite literally save your life!  And if your first colonoscopy is normal and you have no family history of colon cancer, most women don't have to have  it again for 10 years.  Once every ten years, you can do something that may end up saving your life, right?  We will be happy to help you get it scheduled when you are ready.  Be sure to check your insurance coverage so you understand how much it will cost.  It may be covered as a preventative service at no cost, but you should check your particular policy.

## 2018-04-26 LAB — CYTOLOGY - PAP
DIAGNOSIS: UNDETERMINED — AB
HPV (WINDOPATH): DETECTED — AB

## 2018-04-28 ENCOUNTER — Ambulatory Visit (INDEPENDENT_AMBULATORY_CARE_PROVIDER_SITE_OTHER): Payer: 59

## 2018-04-28 DIAGNOSIS — R17 Unspecified jaundice: Secondary | ICD-10-CM

## 2018-04-28 DIAGNOSIS — M791 Myalgia, unspecified site: Secondary | ICD-10-CM

## 2018-04-28 DIAGNOSIS — E559 Vitamin D deficiency, unspecified: Secondary | ICD-10-CM | POA: Diagnosis not present

## 2018-04-28 DIAGNOSIS — Z0000 Encounter for general adult medical examination without abnormal findings: Secondary | ICD-10-CM

## 2018-04-28 DIAGNOSIS — Z833 Family history of diabetes mellitus: Secondary | ICD-10-CM

## 2018-04-29 ENCOUNTER — Telehealth: Payer: Self-pay | Admitting: Emergency Medicine

## 2018-04-29 DIAGNOSIS — B977 Papillomavirus as the cause of diseases classified elsewhere: Secondary | ICD-10-CM

## 2018-04-29 DIAGNOSIS — Z87410 Personal history of cervical dysplasia: Secondary | ICD-10-CM

## 2018-04-29 LAB — LIPID PANEL
Chol/HDL Ratio: 2.8 ratio (ref 0.0–4.4)
Cholesterol, Total: 178 mg/dL (ref 100–199)
HDL: 63 mg/dL (ref >39)
LDL Calculated: 105 mg/dL — ABNORMAL HIGH (ref 0–99)
Triglycerides: 48 mg/dL (ref 0–149)
VLDL CHOLESTEROL CAL: 10 mg/dL (ref 5–40)

## 2018-04-29 LAB — COMPREHENSIVE METABOLIC PANEL
ALK PHOS: 32 IU/L — AB (ref 39–117)
ALT: 12 IU/L (ref 0–32)
AST: 9 IU/L (ref 0–40)
Albumin/Globulin Ratio: 2.3 — ABNORMAL HIGH (ref 1.2–2.2)
Albumin: 4.2 g/dL (ref 3.5–5.5)
BUN/Creatinine Ratio: 15 (ref 9–23)
BUN: 11 mg/dL (ref 6–24)
Bilirubin Total: 1.9 mg/dL — ABNORMAL HIGH (ref 0.0–1.2)
CO2: 24 mmol/L (ref 20–29)
Calcium: 9.2 mg/dL (ref 8.7–10.2)
Chloride: 104 mmol/L (ref 96–106)
Creatinine, Ser: 0.74 mg/dL (ref 0.57–1.00)
GFR calc Af Amer: 112 mL/min/{1.73_m2} (ref >59)
GFR calc non Af Amer: 97 mL/min/{1.73_m2} (ref >59)
Globulin, Total: 1.8 g/dL (ref 1.5–4.5)
Glucose: 90 mg/dL (ref 65–99)
Potassium: 4.5 mmol/L (ref 3.5–5.2)
Sodium: 143 mmol/L (ref 134–144)
Total Protein: 6 g/dL (ref 6.0–8.5)

## 2018-04-29 LAB — BILIRUBIN, FRACTIONATED(TOT/DIR/INDIR)
Bilirubin, Direct: 0.36 mg/dL (ref 0.00–0.40)
Bilirubin, Indirect: 1.54 mg/dL — ABNORMAL HIGH (ref 0.10–0.80)

## 2018-04-29 LAB — CBC
Hematocrit: 37 % (ref 34.0–46.6)
Hemoglobin: 12.6 g/dL (ref 11.1–15.9)
MCH: 31.8 pg (ref 26.6–33.0)
MCHC: 34.1 g/dL (ref 31.5–35.7)
MCV: 93 fL (ref 79–97)
PLATELETS: 247 10*3/uL (ref 150–450)
RBC: 3.96 x10E6/uL (ref 3.77–5.28)
RDW: 11.4 % — ABNORMAL LOW (ref 12.3–15.4)
WBC: 3.1 10*3/uL — AB (ref 3.4–10.8)

## 2018-04-29 LAB — HEMOGLOBIN A1C
ESTIMATED AVERAGE GLUCOSE: 108 mg/dL
Hgb A1c MFr Bld: 5.4 % (ref 4.8–5.6)

## 2018-04-29 LAB — TSH: TSH: 2.35 u[IU]/mL (ref 0.450–4.500)

## 2018-04-29 LAB — VITAMIN D 25 HYDROXY (VIT D DEFICIENCY, FRACTURES): Vit D, 25-Hydroxy: 22.2 ng/mL — ABNORMAL LOW (ref 30.0–100.0)

## 2018-04-29 NOTE — Telephone Encounter (Signed)
-----   Message from Sue Dom, MD sent at 04/28/2018 12:46 PM EST ----- Please inform the patient that her pap is abnormal (again). She needs another colposcopy

## 2018-04-30 NOTE — Telephone Encounter (Signed)
-----   Message from Salvadore Dom, MD sent at 04/29/2018  5:01 PM EST ----- Please let the patient know that her bilirubin is still mildly elevated, but stable. Her other LFT's are normal.  Her WBC is slightly low, I'm adding a differential to further evaluate that.  Her vit d is low, she should increase her daily vit d intake by 1,000 IU a day Her other blood work is normal.

## 2018-04-30 NOTE — Telephone Encounter (Signed)
Message left to return call to Sue Lopez at [phone].    

## 2018-05-03 NOTE — Telephone Encounter (Signed)
Results given patient verbalized understanding.  Colposcopy scheduled for 05/04/18 with Dr. Talbert Nan.  Order placed for precert.  Will call back with any questions prior to appointment.

## 2018-05-04 ENCOUNTER — Ambulatory Visit (INDEPENDENT_AMBULATORY_CARE_PROVIDER_SITE_OTHER): Payer: 59 | Admitting: Obstetrics and Gynecology

## 2018-05-04 ENCOUNTER — Encounter: Payer: Self-pay | Admitting: Obstetrics and Gynecology

## 2018-05-04 VITALS — BP 100/58 | HR 80 | Resp 16 | Ht 65.0 in | Wt 142.0 lb

## 2018-05-04 DIAGNOSIS — B977 Papillomavirus as the cause of diseases classified elsewhere: Secondary | ICD-10-CM | POA: Diagnosis not present

## 2018-05-04 DIAGNOSIS — D72819 Decreased white blood cell count, unspecified: Secondary | ICD-10-CM | POA: Diagnosis not present

## 2018-05-04 DIAGNOSIS — R87610 Atypical squamous cells of undetermined significance on cytologic smear of cervix (ASC-US): Secondary | ICD-10-CM | POA: Diagnosis not present

## 2018-05-04 NOTE — Progress Notes (Signed)
GYNECOLOGY  VISIT   HPI: 47 y.o.   Married White or Caucasian Not Hispanic or Latino  female   G0P0000 with Patient's last menstrual period was 04/27/2018.   here for colposcopy  04/22/18 ASCUS with positive HR HPV, 04/06/2017 neg with positive HR HPV, colposcopy with CIN I 01/31/14 neg with positive HR HPV    GYNECOLOGIC HISTORY: Patient's last menstrual period was 04/27/2018. Contraception: Female partner Menopausal hormone therapy: none        OB History    Gravida  0   Para  0   Term  0   Preterm  0   AB  0   Living  0     SAB  0   TAB  0   Ectopic  0   Multiple  0   Live Births  0              Patient Active Problem List   Diagnosis Date Noted  . Muscle ache 07/10/2017  . Fatigue 07/10/2017  . Elevated bilirubin 07/10/2017  . Vitamin D deficiency 07/10/2017  . VARICOSE VEINS, LOWER EXTREMITIES, WITH INFLAMMATION 09/19/2009    Past Medical History:  Diagnosis Date  . Elevated bilirubin 07/10/2017  . Fatigue 07/10/2017  . Genital warts   . Muscle ache 07/10/2017  . Vitamin D deficiency 07/10/2017    Past Surgical History:  Procedure Laterality Date  . ANTERIOR CRUCIATE LIGAMENT REPAIR  1989  . [place]   right  . VARICOSE VEIN SURGERY Right     Current Outpatient Medications  Medication Sig Dispense Refill  . acetaminophen (TYLENOL) 325 MG tablet Take 650 mg by mouth as needed.    Marland Kitchen ibuprofen (ADVIL,MOTRIN) 200 MG tablet Take 200 mg by mouth every 6 (six) hours as needed.     No current facility-administered medications for this visit.      ALLERGIES: Patient has no known allergies.  Family History  Problem Relation Age of Onset  . Alcohol abuse Father   . Diabetes Father   . Heart Problems Father   . Stroke Father   . Alcohol abuse Mother   . COPD Mother   . Cancer Maternal Grandmother        lung  . Heart disease Paternal Grandmother   . Heart disease Paternal Grandfather     Social History    Socioeconomic History  . Marital status: Married    Spouse name: Anderson Malta  . Number of children: Not on file  . Years of education: Not on file  . Highest education level: Not on file  Occupational History  . Not on file  Social Needs  . Financial resource strain: Not on file  . Food insecurity:    Worry: Not on file    Inability: Not on file  . Transportation needs:    Medical: Not on file    Non-medical: Not on file  Tobacco Use  . Smoking status: Never Smoker  . Smokeless tobacco: Never Used  Substance and Sexual Activity  . Alcohol use: Yes    Comment: occasion glass of wine  . Drug use: No  . Sexual activity: Yes    Partners: Female    Birth control/protection: None    Comment: female partner  Lifestyle  . Physical activity:    Days per week: Not on file    Minutes per session: Not on file  . Stress: Not on file  Relationships  . Social connections:  Talks on phone: Not on file    Gets together: Not on file    Attends religious service: Not on file    Active member of club or organization: Not on file    Attends meetings of clubs or organizations: Not on file    Relationship status: Not on file  . Intimate partner violence:    Fear of current or ex partner: Not on file    Emotionally abused: Not on file    Physically abused: Not on file    Forced sexual activity: Not on file  Other Topics Concern  . Not on file  Social History Narrative   No exercise, fair diet. Weight stable. She is married to female.    Review of Systems  Constitutional: Negative.   HENT: Negative.   Eyes: Negative.   Respiratory: Negative.   Cardiovascular: Negative.   Gastrointestinal: Negative.   Genitourinary: Negative.   Musculoskeletal: Negative.   Skin: Negative.   Neurological: Negative.   Endo/Heme/Allergies: Negative.   Psychiatric/Behavioral: Negative.     PHYSICAL EXAMINATION:    BP (!) 100/58 (BP Location: Right Arm, Patient Position: Sitting, Cuff Size:  Normal)   Pulse 80   Resp 16   Ht 5\' 5"  (1.651 m)   Wt 142 lb (64.4 kg)   LMP 04/27/2018   BMI 23.63 kg/m     General appearance: alert, cooperative and appears stated age  Pelvic: External genitalia:  no lesions              Urethra:  normal appearing urethra with no masses, tenderness or lesions              Bartholins and Skenes: normal                 Vagina: normal appearing vagina with normal color and discharge, no lesions              Cervix: no lesions  Colposcopy: unsatisfactory, no aceto-white changes. Negative lugols examination of the cervix and upper vagina. ECC done  Chaperone was present for exam.  ASSESSMENT ASCUS, +HPV pap, unsatisfactory colposcopy H/O CIN I Leukopenia Stable, mildly elevated bilirubin    PLAN ECC sent Discussed her leukocytosis, recommended f/u with primary MD Discussed HPV, possible need for treatment and continued need for f/u.Questions answered    An After Visit Summary was printed and given to the patient.

## 2018-05-04 NOTE — Patient Instructions (Signed)

## 2018-05-05 LAB — WHITE BLOOD COUNT AND DIFFERENTIAL
BASOS: 2 %
Basophils Absolute: 0.1 10*3/uL (ref 0.0–0.2)
EOS (ABSOLUTE): 0.1 10*3/uL (ref 0.0–0.4)
Eos: 2 %
Immature Grans (Abs): 0 10*3/uL (ref 0.0–0.1)
Immature Granulocytes: 0 %
Lymphocytes Absolute: 1.5 10*3/uL (ref 0.7–3.1)
Lymphs: 48 %
Monocytes Absolute: 0.2 10*3/uL (ref 0.1–0.9)
Monocytes: 5 %
Neutrophils Absolute: 1.4 10*3/uL (ref 1.4–7.0)
Neutrophils: 43 %
WBC: 3.2 10*3/uL — ABNORMAL LOW (ref 3.4–10.8)

## 2018-05-05 LAB — SPECIMEN STATUS REPORT

## 2018-05-06 ENCOUNTER — Telehealth: Payer: Self-pay

## 2018-05-06 NOTE — Telephone Encounter (Signed)
Left message to return call to our office. So that I can make new patient app.

## 2018-05-06 NOTE — Telephone Encounter (Signed)
Spoke with patient. Results given. Patient verbalizes understanding. Patient states that she would like to establish care with Dr.Wallace as recommended by Dr.Jertson at her visit for follow up on WBC. Advised will send labs to Dr.Wallace's office for scheduling. Aware she will be contacted to schedule.  Cc: Jo-Ellen Samule Ohm  Routing to provider and will close encounter.

## 2018-05-06 NOTE — Telephone Encounter (Signed)
-----   Message from Salvadore Dom, MD sent at 05/03/2018  9:27 AM EST ----- She has mild neutropenia and should f/u with her primary for further evaluation. Multiple things can cause the WBC to be low.

## 2018-05-06 NOTE — Telephone Encounter (Signed)
Left message to call Manolito Jurewicz at [phone]. 

## 2018-05-06 NOTE — Telephone Encounter (Signed)
Patient returned call to [place].

## 2018-05-14 NOTE — Telephone Encounter (Signed)
Left message to return call to our office.  

## 2018-05-18 NOTE — Telephone Encounter (Signed)
Spoke to patient she will be out of town next week app made for 06/04/18. She will call If not a good time.

## 2018-06-01 ENCOUNTER — Encounter: Payer: Self-pay | Admitting: Obstetrics and Gynecology

## 2018-06-03 NOTE — Progress Notes (Signed)
Sue Lopez is a 48 y.o. female is here to [place].   Patient Care Team: Briscoe Deutscher, DO as PCP - General (Family Medicine)   History of Present Illness:   HPI: Patient presents to establish care. Referral from GYN. Frustrated with lack of guidance re: malaise and fatigue.   Social History   Social History Narrative   Active, but no regular exercise. Eats well, mostly Paleo. Likes to drink 1-2 glasses of wine or beer several days per week, but is mindful to watch this because of strong family history of alcoholism. No children, though she and her wife tried a few years ago. She is happy with current life without children, but admits that her wife struggles with it. 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 Admits that she "hates doctors" due to some poor experiences in the past. It is important to her to feel "heard." Prefers not to take medications if possible, but open if needed. Okay for all preventive maintenance. Mother and father divorced when she was young. Father raised her, non-optimal environment, emotionally absent. She has a difficult time connecting with others, especially physically, but feels that she is an extrovert in that she recharges via time with friends.    Health Maintenance Due  Topic Date Due  . HIV Screening  04/26/1987  . TETANUS/TDAP  11/17/2014   Depression screen PHQ 2/9 04/01/2017  Decreased Interest 1  Down, Depressed, Hopeless 0  PHQ - 2 Score 1   PMHx, SurgHx, SocialHx, Medications, and Allergies were reviewed in the Visit Navigator and updated as appropriate.   Past Medical History:  Diagnosis Date  . ACL (anterior cruciate ligament) tear 10/18/1986  . Chickenpox   . Elevated bilirubin 07/10/2017  . Family Hx of alcoholism, both parents 06/05/2018  . Genital warts   . Hyperbilirubinemia 06/05/2018  . Irregular menses 06/05/2018  . Vitamin D deficiency 07/10/2017     Past Surgical History:  Procedure Laterality  Date  . ANTERIOR CRUCIATE LIGAMENT REPAIR Right 1989  . BREAST SURGERY  04/18/2017  . VARICOSE VEIN SURGERY Right      Family History  Problem Relation Age of Onset  . Alcohol abuse Father   . Diabetes Father   . Heart Problems Father   . Stroke Father   . Alcohol abuse Mother   . COPD Mother   . Hypertension Mother   . Heart disease Paternal Grandmother   . Hyperlipidemia Paternal Grandmother   . Heart disease Paternal Grandfather   . Hyperlipidemia Paternal Grandfather   . Alcohol abuse Maternal Grandfather   . Hypertension Maternal Grandfather   . Lung cancer Maternal Grandfather     Social History   Tobacco Use  . Smoking status: Never Smoker  . Smokeless tobacco: Former Systems developer    Types: Chew  Substance Use Topics  . Alcohol use: Yes    Alcohol/week: 5.0 - 7.0 standard drinks    Types: 2 - 3 Glasses of wine, 2 - 3 Cans of beer, 1 Shots of liquor per week    Comment: occasion glass of wine  . Drug use: No    Current Medications and Allergies:   .  acetaminophen (TYLENOL) 325 MG tablet, Take 650 mg by mouth as needed., Disp: , Rfl:  .  ibuprofen (ADVIL,MOTRIN) 200 MG tablet, Take 200 mg by mouth every 6 (six) hours as needed., Disp: , Rfl:  .  Specialty Vitamins Products (WOMENS VITA PAK PO), Take by mouth., Disp: ,  Rfl:   No Known Allergies   Review of Systems:   Pertinent items are noted in the HPI. Otherwise, a complete ROS is negative.  Vitals:   Vitals:   06/04/18 1019  BP: 102/60  Pulse: (!) 58  Temp: 98.4 F (36.9 C)  TempSrc: Oral  SpO2: 100%  Weight: 145 lb 12.8 oz (66.1 kg)  Height: 5\' 3"  (1.6 m)     Body mass index is 25.83 kg/m.  Physical Exam:   Physical Exam Vitals signs and nursing note reviewed.  HENT:     Head: Normocephalic and atraumatic.  Eyes:     Pupils: Pupils are equal, round, and reactive to light.  Neck:     Musculoskeletal: Normal range of motion and neck supple.  Cardiovascular:     Rate and Rhythm: Normal rate  and regular rhythm.     Heart sounds: Normal heart sounds.  Pulmonary:     Effort: Pulmonary effort is normal.  Abdominal:     Palpations: Abdomen is soft.  Skin:    General: Skin is warm.  Psychiatric:        Behavior: Behavior normal.    Assessment and Plan:   Diagnoses and all orders for this visit:  Anxiety Comments: Difficult to diagnosis as generalized v social v situational. Patient opened up by the end of the visit but will likely need more time to be comfortable here.  Orders: -     Cancel: Ambulatory referral to Psychiatry -     Ambulatory referral to Psychology  Need for Tdap vaccination -     Tdap vaccine greater than or equal to 7yo IM  Malaise and fatigue Comments: See labs and orders. Will supplement B12 with goal > 400. Will obtain sleep study. Counseling recommended. Possibly perimenopausal.  Orders: -     CBC with Differential/Platelet -     B12 -     Iron and TIBC -     Iron,Total/Total Iron Binding Cap  Sleep-disordered breathing Comments: Wife tells her that she snores very loudly. With fatigue and general malaise, will obtain sleep study.  Orders: -     Ambulatory referral to Pulmonology  Family Hx of alcoholism, both parents Comments: With sub-optimal childhood. Patient would benefit from counseling. Provided a few options.  Orders: -     Ambulatory referral to Psychology  Irregular menses, followed by GYN  Abnormal sensation of leg Comments: Bilateral medial lower legs.   Hyperbilirubinemia Comments: No prior work-up. Likely benign, but will obtain US.   Hx of abnormal cervical Pap smear, followed by GYN Comments: Recent notes reviewed.   Fibrocystic breast changes, with mastalgia intermittently, with benign work-up    . Orders and follow up as documented in [place], reviewed diet, exercise and weight control, cardiovascular risk and specific lipid/LDL goals reviewed, reviewed medications and side effects in detail.   . Reviewed expectations re: course of current medical issues. . Outlined signs and symptoms indicating need for more acute intervention. . Patient verbalized understanding and all questions were answered. . Patient received an After Visit Summary.  Briscoe Deutscher, DO [place] 06/05/2018  Records requested if needed. Time spent with the patient: 45 minutes, of which >50% was spent in obtaining information about her symptoms, reviewing her previous labs, evaluations, and treatments, counseling her about her condition (please see the discussed topics above), and developing a plan to further investigate it; she had a number of questions which I addressed.

## 2018-06-04 ENCOUNTER — Encounter: Payer: Self-pay | Admitting: Family Medicine

## 2018-06-04 ENCOUNTER — Ambulatory Visit (INDEPENDENT_AMBULATORY_CARE_PROVIDER_SITE_OTHER): Payer: 59 | Admitting: Family Medicine

## 2018-06-04 VITALS — BP 102/60 | HR 58 | Temp 98.4°F | Ht 63.0 in | Wt 145.8 lb

## 2018-06-04 DIAGNOSIS — R5383 Other fatigue: Secondary | ICD-10-CM | POA: Diagnosis not present

## 2018-06-04 DIAGNOSIS — R5381 Other malaise: Secondary | ICD-10-CM | POA: Diagnosis not present

## 2018-06-04 DIAGNOSIS — N926 Irregular menstruation, unspecified: Secondary | ICD-10-CM

## 2018-06-04 DIAGNOSIS — Z811 Family history of alcohol abuse and dependence: Secondary | ICD-10-CM

## 2018-06-04 DIAGNOSIS — F419 Anxiety disorder, unspecified: Secondary | ICD-10-CM | POA: Diagnosis not present

## 2018-06-04 DIAGNOSIS — Z23 Encounter for immunization: Secondary | ICD-10-CM | POA: Diagnosis not present

## 2018-06-04 DIAGNOSIS — E611 Iron deficiency: Secondary | ICD-10-CM | POA: Diagnosis not present

## 2018-06-04 DIAGNOSIS — G4730 Sleep apnea, unspecified: Secondary | ICD-10-CM | POA: Diagnosis not present

## 2018-06-04 DIAGNOSIS — N6019 Diffuse cystic mastopathy of unspecified breast: Secondary | ICD-10-CM

## 2018-06-04 DIAGNOSIS — Z8742 Personal history of other diseases of the female genital tract: Secondary | ICD-10-CM

## 2018-06-04 DIAGNOSIS — R209 Unspecified disturbances of skin sensation: Secondary | ICD-10-CM

## 2018-06-04 LAB — CBC WITH DIFFERENTIAL/PLATELET
Basophils Absolute: 0 10*3/uL (ref 0.0–0.1)
Basophils Relative: 1.1 % (ref 0.0–3.0)
Eosinophils Absolute: 0.1 10*3/uL (ref 0.0–0.7)
Eosinophils Relative: 1.4 % (ref 0.0–5.0)
HCT: 41.2 % (ref 36.0–46.0)
Hemoglobin: 13.9 g/dL (ref 12.0–15.0)
Lymphocytes Relative: 42.6 % (ref 12.0–46.0)
Lymphs Abs: 1.7 10*3/uL (ref 0.7–4.0)
MCHC: 33.8 g/dL (ref 30.0–36.0)
MCV: 92.6 fl (ref 78.0–100.0)
Monocytes Absolute: 0.2 10*3/uL (ref 0.1–1.0)
Monocytes Relative: 5.5 % (ref 3.0–12.0)
Neutro Abs: 2 10*3/uL (ref 1.4–7.7)
Neutrophils Relative %: 49.4 % (ref 43.0–77.0)
Platelets: 258 10*3/uL (ref 150.0–400.0)
RBC: 4.45 Mil/uL (ref 3.87–5.11)
RDW: 12.7 % (ref 11.5–15.5)
WBC: 4.1 10*3/uL (ref 4.0–10.5)

## 2018-06-04 LAB — VITAMIN B12: Vitamin B-12: 194 pg/mL — ABNORMAL LOW (ref 211–911)

## 2018-06-05 ENCOUNTER — Encounter: Payer: Self-pay | Admitting: Family Medicine

## 2018-06-05 DIAGNOSIS — R209 Unspecified disturbances of skin sensation: Secondary | ICD-10-CM | POA: Insufficient documentation

## 2018-06-05 DIAGNOSIS — F419 Anxiety disorder, unspecified: Secondary | ICD-10-CM | POA: Insufficient documentation

## 2018-06-05 DIAGNOSIS — N926 Irregular menstruation, unspecified: Secondary | ICD-10-CM

## 2018-06-05 DIAGNOSIS — N6019 Diffuse cystic mastopathy of unspecified breast: Secondary | ICD-10-CM | POA: Insufficient documentation

## 2018-06-05 DIAGNOSIS — Z8742 Personal history of other diseases of the female genital tract: Secondary | ICD-10-CM | POA: Insufficient documentation

## 2018-06-05 DIAGNOSIS — Z811 Family history of alcohol abuse and dependence: Secondary | ICD-10-CM

## 2018-06-05 HISTORY — DX: Irregular menstruation, unspecified: N92.6

## 2018-06-05 HISTORY — DX: Family history of alcohol abuse and dependence: Z81.1

## 2018-06-05 HISTORY — DX: Other disorders of bilirubin metabolism: E80.6

## 2018-06-05 LAB — IRON, TOTAL/TOTAL IRON BINDING CAP
%SAT: 25 % (calc) (ref 16–45)
Iron: 85 ug/dL (ref 40–190)
TIBC: 342 mcg/dL (calc) (ref 250–450)

## 2018-06-09 ENCOUNTER — Encounter: Payer: Self-pay | Admitting: Family Medicine

## 2018-06-09 DIAGNOSIS — R17 Unspecified jaundice: Secondary | ICD-10-CM

## 2018-06-14 ENCOUNTER — Ambulatory Visit
Admission: RE | Admit: 2018-06-14 | Discharge: 2018-06-14 | Disposition: A | Discharge status: Home / Self Care | Payer: 59 | Source: Ambulatory Visit | Attending: Family Medicine | Admitting: Family Medicine

## 2018-06-14 DIAGNOSIS — R17 Unspecified jaundice: Secondary | ICD-10-CM

## 2018-06-14 DIAGNOSIS — K7689 Other specified diseases of liver: Secondary | ICD-10-CM | POA: Diagnosis not present

## 2018-06-28 ENCOUNTER — Ambulatory Visit (INDEPENDENT_AMBULATORY_CARE_PROVIDER_SITE_OTHER): Payer: 59 | Admitting: Pulmonary Disease

## 2018-06-28 ENCOUNTER — Encounter: Payer: Self-pay | Admitting: Pulmonary Disease

## 2018-06-28 VITALS — BP 96/60 | HR 58 | Ht 65.0 in | Wt 143.6 lb

## 2018-06-28 DIAGNOSIS — R0683 Snoring: Secondary | ICD-10-CM | POA: Diagnosis not present

## 2018-06-28 NOTE — Progress Notes (Signed)
Sue Lopez    [id number]    17-Apr-1971  Primary Care Physician:Wallace, Danae Chen, DO  Referring Physician: Briscoe Deutscher, [address] [place], [place] [address]  Chief complaint:   Patient with a history of snoring  HPI:  Patient with snoring, no witnessed apneas Occasional dryness of her mouth No significant headaches States she is not really sleepy during the day Usually goes to bed about 9:51 PM-May take her from 0 to 2 hours to fall asleep Wakes up between 6 and 7 AM Multiple awakenings, occasionally sometimes to use the bathroom  Has a sibling with OSA  Has never been told about apneas  May have associated depression from being worked up  Does not smoke   Outpatient Encounter Medications as of 06/28/2018  Medication Sig  . acetaminophen (TYLENOL) 325 MG tablet Take 650 mg by mouth as needed.  Marland Kitchen ibuprofen (ADVIL,MOTRIN) 200 MG tablet Take 200 mg by mouth every 6 (six) hours as needed.  Marland Kitchen Specialty Vitamins Products (WOMENS VITA PAK PO) Take by mouth.   No facility-administered encounter medications on file as of 06/28/2018.     Allergies as of 06/28/2018  . (No Known Allergies)    Past Medical History:  Diagnosis Date  . ACL (anterior cruciate ligament) tear 10/18/1986  . Chickenpox   . Elevated bilirubin 07/10/2017  . Family Hx of alcoholism, both parents 06/05/2018  . Genital warts   . Hyperbilirubinemia 06/05/2018  . Irregular menses 06/05/2018  . Vitamin D deficiency 07/10/2017    Past Surgical History:  Procedure Laterality Date  . ANTERIOR CRUCIATE LIGAMENT REPAIR Right 1989  . BREAST SURGERY  04/18/2017  . VARICOSE VEIN SURGERY Right     Family History  Problem Relation Age of Onset  . Alcohol abuse Father   . Diabetes Father   . Heart Problems Father   . Stroke Father   . Alcohol abuse Mother   . COPD Mother   . Hypertension Mother   . Heart disease Paternal Grandmother   . Hyperlipidemia Paternal Grandmother   . Heart  disease Paternal Grandfather   . Hyperlipidemia Paternal Grandfather   . Alcohol abuse Maternal Grandfather   . Hypertension Maternal Grandfather   . Lung cancer Maternal Grandfather     Social History   Socioeconomic History  . Marital status: Married    Spouse name: Anderson Malta  . Number of children: Not on file  . Years of education: Not on file  . Highest education level: Not on file  Occupational History  . Occupation: Scientist, research (life sciences): [employer]   Social Needs  . Financial resource strain: Not hard at all  . Food insecurity:    Worry: Never true    Inability: Never true  . Transportation needs:    Medical: No    Non-medical: No  Tobacco Use  . Smoking status: Never Smoker  . Smokeless tobacco: Former Systems developer    Types: Chew  Substance and Sexual Activity  . Alcohol use: Yes    Alcohol/week: 5.0 - 7.0 standard drinks    Types: 2 - 3 Glasses of wine, 2 - 3 Cans of beer, 1 Shots of liquor per week    Comment: occasion glass of wine  . Drug use: No  . Sexual activity: Yes    Partners: Female    Birth control/protection: None    Comment: female partner  Lifestyle  . Physical activity:    Days per  week: Not on file    Minutes per session: Not on file  . Stress: Not on file  Relationships  . Social connections:    Talks on phone: Not on file    Gets together: Not on file    Attends religious service: Not on file    Active member of club or organization: Not on file    Attends meetings of clubs or organizations: Not on file    Relationship status: Not on file  . Intimate partner violence:    Fear of current or ex partner: Not on file    Emotionally abused: Not on file    Physically abused: Not on file    Forced sexual activity: Not on file  Other Topics Concern  . Not on file  Social History Narrative   Active, but no regular exercise. Eats well, mostly Paleo. Likes to drink 1-2 glasses of wine or beer several days per week, but is mindful to watch  this because of strong family history of alcoholism. No children, though she and her wife tried a few years ago. She is happy with current life without children, but admits that her wife struggles with it. 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 Admits that she "hates doctors" due to some poor experiences in the past. It is important to her to feel "heard." Prefers not to take medications if possible, but open if needed. Okay for all preventive maintenance. Mother and father divorced when she was young. Father raised her, non-optimal environment, emotionally absent. She has a difficult time connecting with others, especially physically, but feels that she is an extrovert in that she recharges via time with friends.     Review of Systems  Constitutional: Negative.   HENT: Negative.   Respiratory: Negative.  Negative for shortness of breath.   Cardiovascular: Negative.   Psychiatric/Behavioral: Positive for sleep disturbance.    Vitals:   06/28/18 1037  BP: 96/60  Pulse: (!) 58  SpO2: 100%     Physical Exam  Constitutional: She appears well-developed and well-nourished.  HENT:  Head: Normocephalic and atraumatic.  Mallampati 2  Eyes: Pupils are equal, round, and reactive to light. Conjunctivae are normal. Right eye exhibits no discharge.  Neck: Normal range of motion. Neck supple. No tracheal deviation present. No thyromegaly present.  Cardiovascular: Normal rate and regular rhythm.  Pulmonary/Chest: Effort normal and breath sounds normal. No respiratory distress. She has no wheezes. She has no rales. She exhibits no tenderness.  Skin: She is not diaphoretic.   Results of the Epworth flowsheet 06/28/2018  Sitting and reading 2  Watching TV 2  Sitting, inactive in a public place (e.g. a theatre or a meeting) 1  As a passenger in a car for an hour without a break 0  Lying down to rest in the afternoon when circumstances permit 1  Sitting and talking to  someone 0  Sitting quietly after a lunch without alcohol 0  In a car, while stopped for a few minutes in traffic 0  Total score 6   Assessment:   Moderate probability of significant sleep disordered breathing  History of snoring  Possible depression/insomnia-being worked up -Undiagnosed depression may be contributing to symptoms of insomnia  Plan/Recommendations:  We will schedule patient for home sleep study  Pathophysiology of sleep disordered breathing discussed with the patient  I will see the patient back in the office in about 3 months    Sherrilyn Rist MD [facility] 06/28/2018, 11:05 AM  CC: Briscoe Deutscher, DO

## 2018-06-28 NOTE — Patient Instructions (Signed)
History of snoring Possible sleep apnea  We will schedule you for home sleep study  We will see you tentatively back in about 3 months-this will depend on findings on the sleep study   Sleep Apnea Sleep apnea is a condition in which breathing pauses or becomes shallow during sleep. Episodes of sleep apnea usually last 10 seconds or longer, and they may occur as many as 20 times an hour. Sleep apnea disrupts your sleep and keeps your body from getting the rest that it needs. This condition can increase your risk of certain health problems, including:  Heart attack.  Stroke.  Obesity.  Diabetes.  Heart failure.  Irregular heartbeat. There are three kinds of sleep apnea:  Obstructive sleep apnea. This kind is caused by a blocked or collapsed airway.  Central sleep apnea. This kind happens when the part of the brain that controls breathing does not send the correct signals to the muscles that control breathing.  Mixed sleep apnea. This is a combination of obstructive and central sleep apnea. What are the causes? The most common cause of this condition is a collapsed or blocked airway. An airway can collapse or become blocked if:  Your throat muscles are abnormally relaxed.  Your tongue and tonsils are larger than normal.  You are overweight.  Your airway is smaller than normal. What increases the risk? This condition is more likely to develop in people who:  Are overweight.  Smoke.  Have a smaller than normal airway.  Are elderly.  Are female.  Drink alcohol.  Take sedatives or tranquilizers.  Have a family history of sleep apnea. What are the signs or symptoms? Symptoms of this condition include:  Trouble staying asleep.  Daytime sleepiness and tiredness.  Irritability.  Loud snoring.  Morning headaches.  Trouble concentrating.  Forgetfulness.  Decreased interest in sex.  Unexplained sleepiness.  Mood swings.  Personality changes.  Feelings  of depression.  Waking up often during the night to urinate.  Dry mouth.  Sore throat. How is this diagnosed? This condition may be diagnosed with:  A medical history.  A physical exam.  A series of tests that are done while you are sleeping (sleep study). These tests are usually done in a sleep lab, but they may also be done at home. How is this treated? Treatment for this condition aims to restore normal breathing and to ease symptoms during sleep. It may involve managing health issues that can affect breathing, such as high blood pressure or obesity. Treatment may include:  Sleeping on your side.  Using a decongestant if you have nasal congestion.  Avoiding the use of depressants, including alcohol, sedatives, and narcotics.  Losing weight if you are overweight.  Making changes to your diet.  Quitting smoking.  Using a device to open your airway while you sleep, such as: ? An oral appliance. This is a custom-made mouthpiece that shifts your lower jaw forward. ? A continuous positive airway pressure (CPAP) device. This device delivers oxygen to your airway through a mask. ? A nasal expiratory positive airway pressure (EPAP) device. This device has valves that you put into each nostril. ? A bi-level positive airway pressure (BPAP) device. This device delivers oxygen to your airway through a mask.  Surgery if other treatments do not work. During surgery, excess tissue is removed to create a wider airway. It is important to get treatment for sleep apnea. Without treatment, this condition can lead to:  High blood pressure.  Coronary artery disease.  (  Men) An inability to achieve or maintain an erection (impotence).  Reduced thinking abilities. Follow these instructions at home:  Make any lifestyle changes that your health care provider recommends.  Eat a healthy, well-balanced diet.  Take over-the-counter and prescription medicines only as told by your health care  provider.  Avoid using depressants, including alcohol, sedatives, and narcotics.  Take steps to lose weight if you are overweight.  If you were given a device to open your airway while you sleep, use it only as told by your health care provider.  Do not use any tobacco products, such as cigarettes, chewing tobacco, and e-cigarettes. If you need help quitting, ask your health care provider.  Keep all follow-up visits as told by your health care provider. This is important. Contact a health care provider if:  The device that you received to open your airway during sleep is uncomfortable or does not seem to be working.  Your symptoms do not improve.  Your symptoms get worse. Get help right away if:  You develop chest pain.  You develop shortness of breath.  You develop discomfort in your back, arms, or stomach.  You have trouble speaking.  You have weakness on one side of your body.  You have drooping in your face. These symptoms may represent a serious problem that is an emergency. Do not wait to see if the symptoms will go away. Get medical help right away. Call your local emergency services (911 in the [place] Do not drive yourself to the hospital. This information is not intended to replace advice given to you by your health care provider. Make sure you discuss any questions you have with your health care provider. Document Released: 04/25/2002 Document Revised: 12/01/2016 Document Reviewed: 02/12/2015 Elsevier Interactive Patient Education  2019 [employer].

## 2018-07-27 ENCOUNTER — Ambulatory Visit (INDEPENDENT_AMBULATORY_CARE_PROVIDER_SITE_OTHER): Payer: 59 | Admitting: Psychology

## 2018-07-27 DIAGNOSIS — F411 Generalized anxiety disorder: Secondary | ICD-10-CM

## 2018-07-28 ENCOUNTER — Other Ambulatory Visit: Payer: Self-pay

## 2018-07-28 DIAGNOSIS — G4733 Obstructive sleep apnea (adult) (pediatric): Secondary | ICD-10-CM | POA: Diagnosis not present

## 2018-07-28 DIAGNOSIS — R0683 Snoring: Secondary | ICD-10-CM

## 2018-08-03 DIAGNOSIS — G4733 Obstructive sleep apnea (adult) (pediatric): Secondary | ICD-10-CM | POA: Diagnosis not present

## 2018-08-06 DIAGNOSIS — L03114 Cellulitis of left upper limb: Secondary | ICD-10-CM | POA: Diagnosis not present

## 2018-08-06 DIAGNOSIS — L237 Allergic contact dermatitis due to plants, except food: Secondary | ICD-10-CM | POA: Diagnosis not present

## 2018-08-10 ENCOUNTER — Ambulatory Visit (INDEPENDENT_AMBULATORY_CARE_PROVIDER_SITE_OTHER): Payer: 59 | Admitting: Psychology

## 2018-08-10 DIAGNOSIS — F411 Generalized anxiety disorder: Secondary | ICD-10-CM | POA: Diagnosis not present

## 2018-08-13 ENCOUNTER — Telehealth: Payer: Self-pay | Admitting: Pulmonary Disease

## 2018-08-13 NOTE — Telephone Encounter (Signed)
Dr. Ander Slade has reviewed the home sleep test this showed Mild OSA.   Recommendations  Optimize efforts to Manage insomnia  And depression   Cpap therapy may be considered for mild osa if patient has significant daytime symptoms despite adequate  Management of his insomnia.   Treatment options are CPAP with the settings auto 5 to 15.    Advise against driving while sleepy & against medication with sedative side effects.    Make Telephone appointment for 3 months for compliance with download with NP.   Patient would like to try to manage insomnia and depression.  Will follow up in July.

## 2018-08-24 ENCOUNTER — Ambulatory Visit: Payer: 59 | Admitting: Psychology

## 2018-09-03 ENCOUNTER — Ambulatory Visit: Payer: 59 | Admitting: Family Medicine

## 2018-09-07 ENCOUNTER — Ambulatory Visit: Payer: 59 | Admitting: Family Medicine

## 2018-09-09 NOTE — Progress Notes (Addendum)
Virtual Visit via Video   I connected with Sue Lopez by a video enabled telemedicine application and verified that I am speaking with the correct person using two identifiers. Location patient: Home Location provider: [facility], Office Persons participating in the virtual visit: Dagmar Adcox, Briscoe Deutscher, DO Lonell Grandchild, CMA acting as scribe for Dr. Briscoe Deutscher.  Lonell Grandchild, CMA acting as scribe for Dr. Briscoe Deutscher.   I discussed the limitations of evaluation and management by telemedicine and the availability of in person appointments. The patient expressed understanding and agreed to proceed.  Subjective:   HPI: Patient following up on breast pain. She has had improvement with breast pain. She changed type of coffee. She still has very little pain but it is like night and say.   Sleep study: Showed mild sleep apnea. Was recommended that she possibly try cpap. Patient states that when she got the call what she got from the call was that she needed to get depression under control. We will follow up on that and see if patient needs to have follow up.   She has started taking vitamins. She has been able to tell difference in mood when she is taking them. She also has had increased vessels busting in hands. She is not taking B-12 supplement at this time. She will start taking B complex daily. We have provided recommendations on that.   She has had increased anxiety in last few days. She has big project coming up with work that is cause of it. She does have a good support system at both home and work. She is aware of issues and working on coping mechanisms.   Reviewed all precautions and expectations with prevention of Covid-19.   ROS: See pertinent positives and negatives per HPI.  Patient Active Problem List   Diagnosis Date Noted  . Anxiety 06/05/2018  . Family Hx of alcoholism, both parents 06/05/2018  . Irregular menses 06/05/2018  . Abnormal sensation of leg  06/05/2018  . Hyperbilirubinemia 06/05/2018  . Hx of abnormal cervical Pap smear, followed by GYN 06/05/2018  . Fibrocystic breast changes 06/05/2018  . Fatigue 07/10/2017  . Elevated bilirubin 07/10/2017  . Vitamin D deficiency 07/10/2017    Social History   Tobacco Use  . Smoking status: Never Smoker  . Smokeless tobacco: Former Systems developer    Types: Chew  Substance Use Topics  . Alcohol use: Yes    Alcohol/week: 5.0 - 7.0 standard drinks    Types: 2 - 3 Glasses of wine, 2 - 3 Cans of beer, 1 Shots of liquor per week    Comment: occasion glass of wine    Current Outpatient Medications:  .  acetaminophen (TYLENOL) 325 MG tablet, Take 650 mg by mouth as needed., Disp: , Rfl:  .  ibuprofen (ADVIL,MOTRIN) 200 MG tablet, Take 200 mg by mouth every 6 (six) hours as needed., Disp: , Rfl:  .  Specialty Vitamins Products (WOMENS VITA PAK PO), Take by mouth., Disp: , Rfl:   No Known Allergies  Objective:   VITALS: Per patient if applicable, see vitals. GENERAL: Alert, appears well and in no acute distress. HEENT: Atraumatic, conjunctiva clear, no obvious abnormalities on inspection of external nose and ears. NECK: Normal movements of the head and neck. CARDIOPULMONARY: No increased WOB. Speaking in clear sentences. I:E ratio WNL.  MS: Moves all visible extremities without noticeable abnormality but c/o low back strain from running down steps last week and right lateral elbow ttp. PSYCH: Pleasant  and cooperative, well-groomed. Speech normal rate and rhythm. Affect is appropriate. Insight and judgement are appropriate. Attention is focused, linear, and appropriate.  NEURO: CN grossly intact. Oriented as arrived to appointment on time with no prompting. Moves both UE equally.  SKIN: No obvious lesions, wounds, erythema, or cyanosis noted on face or hands.  Assessment and Plan:   Robbin was seen today for follow-up.  Diagnoses and all orders for this visit:  Vitamin B 12  deficiency Comments: She has been taking vitamin. Will recheck lab and consider injections.  Orders: -     CBC with Differential/Platelet; Future -     Vitamin B12; Future  Lateral epicondylitis of right elbow Comments: SEE AVS.  Strain of lumbar region, initial encounter Comments: SEE AVS.  Anxiety Comments: Controlled. Continue current treatment.   OSA (obstructive sleep apnea) Comments: Mild. Not treated with CPAP. Message per patient was to work on controlling depression. Will clarify with Sleep Medicine.    . Reviewed expectations re: course of current medical issues. . Discussed self-management of symptoms. . Outlined signs and symptoms indicating need for more acute intervention. . Patient verbalized understanding and all questions were answered. Marland Kitchen Health Maintenance issues including appropriate healthy diet, exercise, and smoking avoidance were discussed with patient. . See orders for this visit as documented in the electronic medical record.  Briscoe Deutscher, DO  Records requested if needed. Time spent: 25 minutes, of which >50% was spent in obtaining information about her symptoms, reviewing her previous labs, evaluations, and treatments, counseling her about her condition (please see the discussed topics above), and developing a plan to further investigate it; she had a number of questions which I addressed.

## 2018-09-10 ENCOUNTER — Ambulatory Visit (INDEPENDENT_AMBULATORY_CARE_PROVIDER_SITE_OTHER): Payer: 59 | Admitting: Family Medicine

## 2018-09-10 ENCOUNTER — Encounter: Payer: Self-pay | Admitting: Family Medicine

## 2018-09-10 ENCOUNTER — Other Ambulatory Visit: Payer: Self-pay

## 2018-09-10 VITALS — Ht 65.0 in | Wt 143.0 lb

## 2018-09-10 DIAGNOSIS — E538 Deficiency of other specified B group vitamins: Secondary | ICD-10-CM

## 2018-09-10 DIAGNOSIS — S39012A Strain of muscle, fascia and tendon of lower back, initial encounter: Secondary | ICD-10-CM | POA: Diagnosis not present

## 2018-09-10 DIAGNOSIS — M7711 Lateral epicondylitis, right elbow: Secondary | ICD-10-CM

## 2018-09-10 DIAGNOSIS — F419 Anxiety disorder, unspecified: Secondary | ICD-10-CM | POA: Diagnosis not present

## 2018-09-10 DIAGNOSIS — G4733 Obstructive sleep apnea (adult) (pediatric): Secondary | ICD-10-CM

## 2018-09-10 NOTE — Patient Instructions (Addendum)
Low Back Strain Rehab Ask your health care provider which exercises are safe for you. Do exercises exactly as told by your health care provider and adjust them as directed. It is normal to feel mild stretching, pulling, tightness, or discomfort as you do these exercises, but you should stop right away if you feel sudden pain or your pain gets worse. Do not begin these exercises until told by your health care provider. Stretching and range of motion exercises These exercises warm up your muscles and joints and improve the movement and flexibility of your back. These exercises also help to relieve pain, numbness, and tingling. Exercise A: Single knee to chest  1. Lie on your back on a firm surface with both legs straight. 2. Bend one of your knees. Use your hands to move your knee up toward your chest until you feel a gentle stretch in your lower back and buttock. ? Hold your leg in this position by holding onto the front of your knee. ? Keep your other leg as straight as possible. 3. Hold for __________ seconds. 4. Slowly return to the starting position. 5. Repeat with your other leg. Repeat __________ times. Complete this exercise __________ times a day. Exercise B: Prone extension on elbows  1. Lie on your abdomen on a firm surface. 2. Prop yourself up on your elbows. 3. Use your arms to help lift your chest up until you feel a gentle stretch in your abdomen and your lower back. ? This will place some of your body weight on your elbows. If this is uncomfortable, try stacking pillows under your chest. ? Your hips should stay down, against the surface that you are lying on. Keep your hip and back muscles relaxed. 4. Hold for __________ seconds. 5. Slowly relax your upper body and return to the starting position. Repeat __________ times. Complete this exercise __________ times a day. Strengthening exercises These exercises build strength and endurance in your back. Endurance is the ability  to use your muscles for a long time, even after they get tired. Exercise C: Pelvic tilt 1. Lie on your back on a firm surface. Bend your knees and keep your feet flat. 2. Tense your abdominal muscles. Tip your pelvis up toward the ceiling and flatten your lower back into the floor. ? To help with this exercise, you may place a small towel under your lower back and try to push your back into the towel. 3. Hold for __________ seconds. 4. Let your muscles relax completely before you repeat this exercise. Repeat __________ times. Complete this exercise __________ times a day. Exercise D: Alternating arm and leg raises  1. Get on your hands and knees on a firm surface. If you are on a hard floor, you may want to use padding to cushion your knees, such as an exercise mat. 2. Line up your arms and legs. Your hands should be below your shoulders, and your knees should be below your hips. 3. Lift your left leg behind you. At the same time, raise your right arm and straighten it in front of you. ? Do not lift your leg higher than your hip. ? Do not lift your arm higher than your shoulder. ? Keep your abdominal and back muscles tight. ? Keep your hips facing the ground. ? Do not arch your back. ? Keep your balance carefully, and do not hold your breath. 4. Hold for __________ seconds. 5. Slowly return to the starting position and repeat with your right leg  and your left arm. Repeat __________ times. Complete this exercise __________times a day. Exercise J: Single leg lower with bent knees 1. Lie on your back on a firm surface. 2. Tense your abdominal muscles and lift your feet off the floor, one foot at a time, so your knees and hips are bent in an "L" shape (at about 90 degrees). ? Your knees should be over your hips and your lower legs should be parallel to the floor. 3. Keeping your abdominal muscles tense and your knee bent, slowly lower one of your legs so your toe touches the ground. 4. Lift  your leg back up to return to the starting position. ? Do not hold your breath. ? Do not let your back arch. Keep your back flat against the ground. 5. Repeat with your other leg. Repeat __________ times. Complete this exercise __________ times a day. Posture and body mechanics  Body mechanics refers to the movements and positions of your body while you do your daily activities. Posture is part of body mechanics. Good posture and healthy body mechanics can help to relieve stress in your body's tissues and joints. Good posture means that your spine is in its natural S-curve position (your spine is neutral), your shoulders are pulled back slightly, and your head is not tipped forward. The following are general guidelines for applying improved posture and body mechanics to your everyday activities. Standing   When standing, keep your spine neutral and your feet about hip-width apart. Keep a slight bend in your knees. Your ears, shoulders, and hips should line up.  When you do a task in which you stand in one place for a long time, place one foot up on a stable object that is 2-4 inches (5-10 cm) high, such as a footstool. This helps keep your spine neutral. Sitting   When sitting, keep your spine neutral and keep your feet flat on the floor. Use a footrest, if necessary, and keep your thighs parallel to the floor. Avoid rounding your shoulders, and avoid tilting your head forward.  When working at a desk or a computer, keep your desk at a height where your hands are slightly lower than your elbows. Slide your chair under your desk so you are close enough to maintain good posture.  When working at a computer, place your monitor at a height where you are looking straight ahead and you do not have to tilt your head forward or downward to look at the screen. Resting   When lying down and resting, avoid positions that are most painful for you.  If you have pain with activities such as sitting,  bending, stooping, or squatting (flexion-based activities), lie in a position in which your body does not bend very much. For example, avoid curling up on your side with your arms and knees near your chest (fetal position).  If you have pain with activities such as standing for a long time or reaching with your arms (extension-based activities), lie with your spine in a neutral position and bend your knees slightly. Try the following positions: ? Lying on your side with a pillow between your knees. ? Lying on your back with a pillow under your knees. Lifting   When lifting objects, keep your feet at least shoulder-width apart and tighten your abdominal muscles.  Bend your knees and hips and keep your spine neutral. It is important to lift using the strength of your legs, not your back. Do not lock your knees straight  out.  Always ask for help to lift heavy or awkward objects. This information is not intended to replace advice given to you by your health care provider. Make sure you discuss any questions you have with your health care provider. Document Released: 05/05/2005 Document Revised: 01/10/2016 Document Reviewed: 02/14/2015 Elsevier Interactive Patient Education  2019 [place] Ask your health care provider which exercises are safe for you. Do exercises exactly as told by your health care provider and adjust them as directed. It is normal to feel mild stretching, pulling, tightness, or discomfort as you do these exercises, but you should stop right away if you feel sudden pain or your pain gets worse. Do not begin these exercises until told by your health care provider. Stretching and range of motion exercises These exercises warm up your muscles and joints and improve the movement and flexibility of your elbow. These exercises also help to relieve pain, numbness, and tingling. Exercise A: Wrist extensor stretch 1. Extend your left / right elbow with your fingers  pointing down. 2. Gently pull the palm of your left / right hand toward you until you feel a gentle stretch on the top of your forearm. 3. To increase the stretch, push your left / right hand toward the outer edge or pinkie side of your forearm. 4. Hold this position for __________ seconds. Repeat __________ times. Complete this exercise __________ times a day. If directed by your health care provider, repeat this stretch except do it with a bent elbow this time. Exercise B: Wrist flexor stretch  1. Extend your left / right elbow and turn your palm upward. 2. Gently pull your left / right palm and fingertips back so your wrist extends and your fingers point more toward the ground. 3. You should feel a gentle stretch on the inside of your forearm. 4. Hold this position for __________ seconds. Repeat __________ times. Complete this exercise __________ times a day. If directed by your health care provider, repeat this stretch except do it with a bent elbow this time. Strengthening exercises These exercises build strength and endurance in your elbow. Endurance is the ability to use your muscles for a long time, even after they get tired. Exercise C: Wrist extensors  1. Sit with your left / right forearm palm-down and fully supported on a table or countertop. Your elbow should be resting below the height of your shoulder. 2. Let your left / right wrist extend over the edge of the surface. 3. Loosely hold a __________ weight or a piece of rubber exercise band or tubing in your left / right hand. Slowly curl your left / right hand up toward your forearm. If you are using band or tubing, hold the band or tubing in place with your other hand to provide resistance. 4. Hold this position for __________ seconds. 5. Slowly return to the starting position. Repeat __________ times. Complete this exercise __________ times a day. Exercise D: Radial deviators  1. Stand with a __________ weight in your left /  righthand. Or, sit while holding a rubber exercise band or tubing with your other arm supported on a table or countertop. Position your hand so your thumb is on top. 2. Raise your hand upward in front of you so your thumb travels toward your forearm, or pull up on the rubber tubing. 3. Hold this position for __________ seconds. 4. Slowly return to the starting position. Repeat __________ times. Complete this exercise __________ times a day.  Exercise E: Eccentric wrist extensors 1. Sit with your left / right forearm palm-down and fully supported on a table or countertop. Your elbow should be resting below the height of your shoulder. 2. If told by your health care provider, hold a __________ weight in your hand. 3. Let your left / right wrist extend over the edge of the surface. 4. Use your other hand to lift up your left / right hand toward your forearm. Keep your forearm on the table. 5. Using only the muscles in your left / right hand, slowly lower your hand back down to the starting position. Repeat __________ times. Complete this exercise __________ times a day. This information is not intended to replace advice given to you by your health care provider. Make sure you discuss any questions you have with your health care provider. Document Released: 05/05/2005 Document Revised: 01/09/2016 Document Reviewed: 02/01/2015 Elsevier Interactive Patient Education  2019 [employer].

## 2018-09-11 ENCOUNTER — Encounter: Payer: Self-pay | Admitting: Family Medicine

## 2018-09-17 ENCOUNTER — Other Ambulatory Visit: Payer: Self-pay

## 2018-09-17 ENCOUNTER — Other Ambulatory Visit (INDEPENDENT_AMBULATORY_CARE_PROVIDER_SITE_OTHER): Payer: 59

## 2018-09-17 DIAGNOSIS — E538 Deficiency of other specified B group vitamins: Secondary | ICD-10-CM | POA: Diagnosis not present

## 2018-09-17 LAB — CBC WITH DIFFERENTIAL/PLATELET
Basophils Absolute: 0.1 10*3/uL (ref 0.0–0.1)
Basophils Relative: 0.9 % (ref 0.0–3.0)
Eosinophils Absolute: 0.1 10*3/uL (ref 0.0–0.7)
Eosinophils Relative: 0.8 % (ref 0.0–5.0)
HCT: 42.4 % (ref 36.0–46.0)
Hemoglobin: 14.5 g/dL (ref 12.0–15.0)
Lymphocytes Relative: 44.5 % (ref 12.0–46.0)
Lymphs Abs: 3 10*3/uL (ref 0.7–4.0)
MCHC: 34.2 g/dL (ref 30.0–36.0)
MCV: 91.4 fl (ref 78.0–100.0)
Monocytes Absolute: 0.4 10*3/uL (ref 0.1–1.0)
Monocytes Relative: 5.3 % (ref 3.0–12.0)
Neutro Abs: 3.3 10*3/uL (ref 1.4–7.7)
Neutrophils Relative %: 48.5 % (ref 43.0–77.0)
Platelets: 290 10*3/uL (ref 150.0–400.0)
RBC: 4.64 Mil/uL (ref 3.87–5.11)
RDW: 12.7 % (ref 11.5–15.5)
WBC: 6.8 10*3/uL (ref 4.0–10.5)

## 2018-09-17 LAB — VITAMIN B12: Vitamin B-12: 507 pg/mL (ref 211–911)

## 2018-10-13 ENCOUNTER — Other Ambulatory Visit (HOSPITAL_COMMUNITY)
Admission: RE | Admit: 2018-10-13 | Discharge: 2018-10-13 | Disposition: A | Discharge status: Home / Self Care | Payer: 59 | Source: Ambulatory Visit | Attending: Family Medicine | Admitting: Family Medicine

## 2018-10-13 ENCOUNTER — Other Ambulatory Visit: Payer: Self-pay

## 2018-10-13 ENCOUNTER — Encounter: Payer: Self-pay | Admitting: Family Medicine

## 2018-10-13 ENCOUNTER — Ambulatory Visit (INDEPENDENT_AMBULATORY_CARE_PROVIDER_SITE_OTHER): Payer: 59 | Admitting: Family Medicine

## 2018-10-13 VITALS — BP 110/62 | HR 81 | Temp 98.9°F | Ht 65.0 in | Wt 141.4 lb

## 2018-10-13 DIAGNOSIS — L989 Disorder of the skin and subcutaneous tissue, unspecified: Secondary | ICD-10-CM

## 2018-10-13 DIAGNOSIS — L820 Inflamed seborrheic keratosis: Secondary | ICD-10-CM | POA: Diagnosis not present

## 2018-10-13 NOTE — Progress Notes (Signed)
Sue Lopez is a 48 y.o. female here for an acute visit.  History of Present Illness:   Lonell Grandchild, CMA acting as scribe for Dr. Briscoe Deutscher.   HPI:   Patient presents for a skin complaint.   Location: left arm   Onset: acute  Duration: 3 weeks and symptoms are improving  Associated symptoms: tender, irritated.  Recent treatment: Triamcinolone cream.   PMHx, SurgHx, SocialHx, Medications, and Allergies were reviewed in the Visit Navigator and updated as appropriate.  Current Medications   Current Outpatient Medications:  .  acetaminophen (TYLENOL) 325 MG tablet, Take 650 mg by mouth as needed., Disp: , Rfl:  .  ibuprofen (ADVIL,MOTRIN) 200 MG tablet, Take 200 mg by mouth every 6 (six) hours as needed., Disp: , Rfl:  .  Specialty Vitamins Products (WOMENS VITA PAK PO), Take by mouth., Disp: , Rfl:    No Known Allergies   Review of Systems   Pertinent items are noted in the HPI. Otherwise, ROS is negative.  Vitals   Vitals:   10/13/18 0939  BP: 110/62  Pulse: 81  Temp: 98.9 F (37.2 C)  TempSrc: Oral  SpO2: 97%  Weight: 141 lb 6.4 oz (64.1 kg)  Height: 5\' 5"  (1.651 m)     Body mass index is 23.53 kg/m.  Physical Exam   Physical Exam Constitutional:      General: She is not in acute distress.    Appearance: She is well-developed.  HENT:     Head: Normocephalic and atraumatic.     Right Ear: External ear normal.     Left Ear: External ear normal.     Mouth/Throat:     Pharynx: No oropharyngeal exudate.  Eyes:     Conjunctiva/sclera: Conjunctivae normal.     Pupils: Pupils are equal, round, and reactive to light.  Neck:     Musculoskeletal: Normal range of motion and neck supple.     Thyroid: No thyromegaly.     Vascular: No JVD.  Cardiovascular:     Rate and Rhythm: Normal rate and regular rhythm.     Heart sounds: Normal heart sounds. No murmur. No friction rub. No gallop.   Pulmonary:     Effort: Pulmonary effort is normal.     Breath  sounds: Normal breath sounds. No wheezing.  Chest:     Chest wall: No tenderness.  Abdominal:     General: Bowel sounds are normal.     Palpations: Abdomen is soft. There is no mass.     Tenderness: There is no abdominal tenderness. There is no guarding or rebound.  Musculoskeletal: Normal range of motion.  Lymphadenopathy:     Cervical: No cervical adenopathy.  Skin:    Findings: Lesion and rash present. Rash is papular.     Comments: Papular rash upper arms bilaterally, pustular, mild. Flesh-colored, smooth with surrounding crust, ttp.   Neurological:     Mental Status: She is alert and oriented to person, place, and time.     Deep Tendon Reflexes: Reflexes are normal and symmetric.  Psychiatric:        Behavior: Behavior normal.        Thought Content: Thought content normal.        Judgment: Judgment normal.    Assessment and Plan   Janann was seen today for rash.  Diagnoses and all orders for this visit:  Skin lesion -     Dermatology pathology([place])  Procedure Note:  Procedure:  Skin biopsy Indication:  Changing mole (s ),  Suspicious lesion(s)  Risks including unsuccessful procedure, bleeding, infection, bruising, scar, a need for another complete procedure and others were explained to the patient in detail as well as the benefits. Verbal consent was obtained. Lesion on left forearm measuring 6 mm  Skin over lesion was prepped with Betadine and alcohol  and anesthetized with 1 cc of 2% lidocaine and epinephrine, using a 25-gauge 1 inch needle.  Shave biopsy with a sterile Dermablade was carried out in the usual fashion. Curette was used to destroy the rest of the lesion potentially left behind. Hemostasis with Drysol. Band-Aid was applied with antibiotic ointment.  . Reviewed expectations re: course of current medical issues. . Discussed self-management of symptoms. . Outlined signs and symptoms indicating need for more acute intervention. . Patient verbalized  understanding and all questions were answered. Marland Kitchen Health Maintenance issues including appropriate healthy diet, exercise, and smoking avoidance were discussed with patient. . See orders for this visit as documented in the electronic medical record. . Patient received an After Visit Summary.  CMA served as Education administrator during this visit. History, Physical, and Plan performed by medical provider. The above documentation has been reviewed and is accurate and complete. Briscoe Deutscher, D.O.  Briscoe Deutscher, DO [place], [facility] 10/15/2018

## 2018-10-15 ENCOUNTER — Encounter: Payer: Self-pay | Admitting: Family Medicine

## 2019-04-21 ENCOUNTER — Encounter: Payer: Self-pay | Admitting: Obstetrics and Gynecology

## 2019-04-25 ENCOUNTER — Other Ambulatory Visit: Payer: Self-pay

## 2019-04-25 NOTE — Progress Notes (Signed)
48 y.o. G0P0000 Married White or Caucasian Not Hispanic or Latino female here for annual exam.    Cycles are monthly, occasionally with have 1.5-3 days of bleeding in the middle of her cycle. More than spotting, but can saturate a pad in a day (flow like with her period). Had bleeding mid October, previously had mid cycle bleeding in 8/20 and 6/20  No hot flashes or night sweats. Falls asleep well, wakes up during the night.   Her breast pain is better, only cyclic now.   Period Duration (Days): 4 days usually, has been having 1 days of bleeding intermittently some months Period Pattern: (!) Irregular Menstrual Flow: Light Menstrual Control: Panty liner, Thin pad Menstrual Control Change Freq (Hours): changing pad every 6-8 hours Dysmenorrhea: None  Patient's last menstrual period was 04/22/2019 (exact date).          Sexually active: Yes.    The current method of family planning is none.    Exercising: No.  The patient does not participate in regular exercise at present. Smoker:  no  Health Maintenance: Pap:04/22/18 ASUCUS, +HPV, Colpo ECC negative. 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  Colonoscopy:n/a BMD:n/a TDaP:11/16/2004, wants tdap today Gardasil:Never   reports that she has never smoked. She quit smokeless tobacco use about 26 years ago.  Her smokeless tobacco use included chew. She reports current alcohol use. She reports that she does not use drugs. She drinks a small glass of wine 3-4 x week. She and her wife are fixing up their house. She is an Chief Financial Officer, Risk manager   Past Medical History:  Diagnosis Date  . ACL (anterior cruciate ligament) tear 10/18/1986  . Chickenpox   . Elevated bilirubin 07/10/2017  . Family Hx of alcoholism, both parents  06/05/2018  . Genital warts   . Hyperbilirubinemia 06/05/2018  . Irregular menses 06/05/2018  . Vitamin D deficiency 07/10/2017    Past Surgical History:  Procedure Laterality Date  . ANTERIOR CRUCIATE LIGAMENT REPAIR Right 1989  . BREAST SURGERY  04/18/2017  . VARICOSE VEIN SURGERY Right     Current Outpatient Medications  Medication Sig Dispense Refill  . acetaminophen (TYLENOL) 325 MG tablet Take 650 mg by mouth as needed.    Marland Kitchen ibuprofen (ADVIL,MOTRIN) 200 MG tablet Take 200 mg by mouth every 6 (six) hours as needed.    Marland Kitchen Specialty Vitamins Products (WOMENS VITA PAK PO) Take by mouth.     No current facility-administered medications for this visit.     Family History  Problem Relation Age of Onset  . Alcohol abuse Father   . Diabetes Father   . Heart Problems Father   . Stroke Father   . Alcohol abuse Mother   . COPD Mother   . Hypertension Mother   . Heart disease Paternal Grandmother   . Hyperlipidemia Paternal Grandmother   . Heart disease Paternal Grandfather   . Hyperlipidemia Paternal Grandfather   . Alcohol abuse Maternal Grandfather   . Hypertension Maternal Grandfather   . Lung cancer Maternal Grandfather     Review of Systems  Constitutional: Negative.   HENT: Negative.   Eyes: Negative.   Respiratory: Negative.   Cardiovascular: Negative.   Gastrointestinal: Negative.   Endocrine: Negative.   Genitourinary: Negative.   Musculoskeletal: Negative.   Skin: Negative.   Allergic/Immunologic:  Negative.   Neurological: Negative.   Hematological: Negative.   Psychiatric/Behavioral: Negative.   slight urge incontinence, only when over full. This started a few weeks ago, better in the last week when she tried to not get over full.   Exam:   BP 118/76 (BP Location: Right Arm, Patient Position: Sitting, Cuff Size: Normal)   Pulse 80   Temp (!) 97.4 F (36.3 C) (Skin)   Ht 5\' 5"  (1.651 m)   Wt 146 lb 3.2 oz (66.3 kg)   LMP 04/22/2019 (Exact Date)   BMI  24.33 kg/m   Weight change: @WEIGHTCHANGE @ Height:   Height: 5\' 5"  (165.1 cm)  Ht Readings from Last 3 Encounters:  04/27/19 5\' 5"  (1.651 m)  10/13/18 5\' 5"  (1.651 m)  09/10/18 5\' 5"  (1.651 m)    General appearance: alert, cooperative and appears stated age Head: Normocephalic, without obvious abnormality, atraumatic Neck: no adenopathy, supple, symmetrical, trachea midline and thyroid normal to inspection and palpation Lungs: clear to auscultation bilaterally Cardiovascular: regular rate and rhythm Breasts: normal appearance, no masses or tenderness Abdomen: soft, non-tender; non distended,  no masses,  no organomegaly Extremities: extremities normal, atraumatic, no cyanosis or edema Skin: Skin color, texture, turgor normal. No rashes or lesions Lymph nodes: Cervical, supraclavicular, and axillary nodes normal. No abnormal inguinal nodes palpated Neurologic: Grossly normal   Pelvic: External genitalia:  no lesions              Urethra:  normal appearing urethra with no masses, tenderness or lesions              Bartholins and Skenes: normal                 Vagina: normal appearing vagina with normal color and discharge, no lesions              Cervix: no lesions               Bimanual Exam:  Uterus:  normal size, contour, position, consistency, mobility, non-tender              Adnexa: no mass, fullness, tenderness               Rectovaginal: Confirms               Anus:  normal sphincter tone, no lesions  Chaperone was present for exam.  A:  Well Woman with normal exam  Intermenstrual spotting/light bleeding intermittently over the last 6 months  H/O HPV  Vit d def  P:   Pap with hpv  Mammogram just done  TDAP today  Discussed breast self exam  Discussed calcium and vit D intake  Screening labs, TSH, vit D  Calendar cycles, call with concerning changes  Vit D

## 2019-04-27 ENCOUNTER — Ambulatory Visit (INDEPENDENT_AMBULATORY_CARE_PROVIDER_SITE_OTHER): Payer: 59 | Admitting: Obstetrics and Gynecology

## 2019-04-27 ENCOUNTER — Other Ambulatory Visit: Payer: Self-pay

## 2019-04-27 ENCOUNTER — Encounter: Payer: Self-pay | Admitting: Obstetrics and Gynecology

## 2019-04-27 ENCOUNTER — Other Ambulatory Visit (HOSPITAL_COMMUNITY)
Admission: RE | Admit: 2019-04-27 | Discharge: 2019-04-27 | Disposition: A | Discharge status: Home / Self Care | Payer: 59 | Source: Ambulatory Visit | Attending: Obstetrics and Gynecology | Admitting: Obstetrics and Gynecology

## 2019-04-27 VITALS — BP 118/76 | HR 80 | Temp 97.4°F | Ht 65.0 in | Wt 146.2 lb

## 2019-04-27 DIAGNOSIS — Z0000 Encounter for general adult medical examination without abnormal findings: Secondary | ICD-10-CM | POA: Diagnosis not present

## 2019-04-27 DIAGNOSIS — Z23 Encounter for immunization: Secondary | ICD-10-CM

## 2019-04-27 DIAGNOSIS — N923 Ovulation bleeding: Secondary | ICD-10-CM | POA: Diagnosis not present

## 2019-04-27 DIAGNOSIS — B977 Papillomavirus as the cause of diseases classified elsewhere: Secondary | ICD-10-CM | POA: Insufficient documentation

## 2019-04-27 DIAGNOSIS — Z124 Encounter for screening for malignant neoplasm of cervix: Secondary | ICD-10-CM

## 2019-04-27 DIAGNOSIS — Z01419 Encounter for gynecological examination (general) (routine) without abnormal findings: Secondary | ICD-10-CM | POA: Diagnosis not present

## 2019-04-27 DIAGNOSIS — E559 Vitamin D deficiency, unspecified: Secondary | ICD-10-CM

## 2019-04-27 NOTE — Patient Instructions (Addendum)
EXERCISE AND DIET:  We recommended that you start or continue a regular exercise program for good health. Regular exercise means any activity that makes your heart beat faster and makes you sweat.  We recommend exercising at least 30 minutes per day at least 3 days a week, preferably 4 or 5.  We also recommend a diet low in fat and sugar.  Inactivity, poor dietary choices and obesity can cause diabetes, heart attack, stroke, and kidney damage, among others.    ALCOHOL AND SMOKING:  Women should limit their alcohol intake to no more than 7 drinks/beers/glasses of wine (combined, not each!) per week. Moderation of alcohol intake to this level decreases your risk of breast cancer and liver damage. And of course, no recreational drugs are part of a healthy lifestyle.  And absolutely no smoking or even second hand smoke. Most people know smoking can cause heart and lung diseases, but did you know it also contributes to weakening of your bones? Aging of your skin?  Yellowing of your teeth and nails?  CALCIUM AND VITAMIN D:  Adequate intake of calcium and Vitamin D are recommended.  The recommendations for exact amounts of these supplements seem to change often, but generally speaking 1,000 mg of calcium (between diet and supplement) and 800 units of Vitamin D per day seems prudent. Certain women may benefit from higher intake of Vitamin D.  If you are among these women, your doctor will have told you during your visit.    PAP SMEARS:  Pap smears, to check for cervical cancer or precancers,  have traditionally been done yearly, although recent scientific advances have shown that most women can have pap smears less often.  However, every woman still should have a physical exam from her gynecologist every year. It will include a breast check, inspection of the vulva and vagina to check for abnormal growths or skin changes, a visual exam of the cervix, and then an exam to evaluate the size and shape of the uterus and  ovaries.  And after 47 years of age, a rectal exam is indicated to check for rectal cancers. We will also provide age appropriate advice regarding health maintenance, like when you should have certain vaccines, screening for sexually transmitted diseases, bone density testing, colonoscopy, mammograms, etc.   MAMMOGRAMS:  All women over 40 years old should have a yearly mammogram. Many facilities now offer a "3D" mammogram, which may cost around $50 extra out of pocket. If possible,  we recommend you accept the option to have the 3D mammogram performed.  It both reduces the number of women who will be called back for extra views which then turn out to be normal, and it is better than the routine mammogram at detecting truly abnormal areas.    COLON CANCER SCREENING: Now recommend starting at age 45. At this time colonoscopy is not covered for routine screening until 50. There are take home tests that can be done between 45-49.   COLONOSCOPY:  Colonoscopy to screen for colon cancer is recommended for all women at age 50.  We know, you hate the idea of the prep.  We agree, BUT, having colon cancer and not knowing it is worse!!  Colon cancer so often starts as a polyp that can be seen and removed at colonscopy, which can quite literally save your life!  And if your first colonoscopy is normal and you have no family history of colon cancer, most women don't have to have it again for   10 years.  Once every ten years, you can do something that may end up saving your life, right?  We will be happy to help you get it scheduled when you are ready.  Be sure to check your insurance coverage so you understand how much it will cost.  It may be covered as a preventative service at no cost, but you should check your particular policy.      Breast Self-Awareness Breast self-awareness means being familiar with how your breasts look and feel. It involves checking your breasts regularly and reporting any changes to your  health care provider. Practicing breast self-awareness is important. A change in your breasts can be a sign of a serious medical problem. Being familiar with how your breasts look and feel allows you to find any problems early, when treatment is more likely to be successful. All women should practice breast self-awareness, including women who have had breast implants. How to do a breast self-exam One way to learn what is normal for your breasts and whether your breasts are changing is to do a breast self-exam. To do a breast self-exam: Look for Changes  1. Remove all the clothing above your waist. 2. Stand in front of a mirror in a room with good lighting. 3. Put your hands on your hips. 4. Push your hands firmly downward. 5. Compare your breasts in the mirror. Look for differences between them (asymmetry), such as: ? Differences in shape. ? Differences in size. ? Puckers, dips, and bumps in one breast and not the other. 6. Look at each breast for changes in your skin, such as: ? Redness. ? Scaly areas. 7. Look for changes in your nipples, such as: ? Discharge. ? Bleeding. ? Dimpling. ? Redness. ? A change in position. Feel for Changes Carefully feel your breasts for lumps and changes. It is best to do this while lying on your back on the floor and again while sitting or standing in the shower or tub with soapy water on your skin. Feel each breast in the following way:  Place the arm on the side of the breast you are examining above your head.  Feel your breast with the other hand.  Start in the nipple area and make  inch (2 cm) overlapping circles to feel your breast. Use the pads of your three middle fingers to do this. Apply light pressure, then medium pressure, then firm pressure. The light pressure will allow you to feel the tissue closest to the skin. The medium pressure will allow you to feel the tissue that is a little deeper. The firm pressure will allow you to feel the tissue  close to the ribs.  Continue the overlapping circles, moving downward over the breast until you feel your ribs below your breast.  Move one finger-width toward the center of the body. Continue to use the  inch (2 cm) overlapping circles to feel your breast as you move slowly up toward your collarbone.  Continue the up and down exam using all three pressures until you reach your armpit.  Write Down What You Find  Write down what is normal for each breast and any changes that you find. Keep a written record with breast changes or normal findings for each breast. By writing this information down, you do not need to depend only on memory for size, tenderness, or location. Write down where you are in your menstrual cycle, if you are still menstruating. If you are having trouble noticing differences   in your breasts, do not get discouraged. With time you will become more familiar with the variations in your breasts and more comfortable with the exam. How often should I examine my breasts? Examine your breasts every month. If you are breastfeeding, the best time to examine your breasts is after a feeding or after using a breast pump. If you menstruate, the best time to examine your breasts is 5-7 days after your period is over. During your period, your breasts are lumpier, and it may be more difficult to notice changes. When should I see my health care provider? See your health care provider if you notice:  A change in shape or size of your breasts or nipples.  A change in the skin of your breast or nipples, such as a reddened or scaly area.  Unusual discharge from your nipples.  A lump or thick area that was not there before.  Pain in your breasts.  Anything that concerns you.   Kegel Exercises  Kegel exercises can help strengthen your pelvic floor muscles. The pelvic floor is a group of muscles that support your rectum, small intestine, and bladder. In females, pelvic floor muscles also  help support the womb (uterus). These muscles help you control the flow of urine and stool. Kegel exercises are painless and simple, and they do not require any equipment. Your provider may suggest Kegel exercises to:  Improve bladder and bowel control.  Improve sexual response.  Improve weak pelvic floor muscles after surgery to remove the uterus (hysterectomy) or pregnancy (females).  Improve weak pelvic floor muscles after prostate gland removal or surgery (males). Kegel exercises involve squeezing your pelvic floor muscles, which are the same muscles you squeeze when you try to stop the flow of urine or keep from passing gas. The exercises can be done while sitting, standing, or lying down, but it is best to vary your position. Exercises How to do Kegel exercises: 1. Squeeze your pelvic floor muscles tight. You should feel a tight lift in your rectal area. If you are a female, you should also feel a tightness in your vaginal area. Keep your stomach, buttocks, and legs relaxed. 2. Hold the muscles tight for up to 10 seconds. 3. Breathe normally. 4. Relax your muscles. 5. Repeat as told by your health care provider. Repeat this exercise daily as told by your health care provider. Continue to do this exercise for at least 4-6 weeks, or for as long as told by your health care provider. You may be referred to a physical therapist who can help you learn more about how to do Kegel exercises. Depending on your condition, your health care provider may recommend:  Varying how long you squeeze your muscles.  Doing several sets of exercises every day.  Doing exercises for several weeks.  Making Kegel exercises a part of your regular exercise routine. This information is not intended to replace advice given to you by your health care provider. Make sure you discuss any questions you have with your health care provider. Document Released: 04/21/2012 Document Revised: 12/23/2017 Document Reviewed:  12/23/2017 Elsevier Patient Education  2020 [employer].

## 2019-04-28 LAB — CBC
Hematocrit: 40.3 % (ref 34.0–46.6)
Hemoglobin: 13.4 g/dL (ref 11.1–15.9)
MCH: 30.5 pg (ref 26.6–33.0)
MCHC: 33.3 g/dL (ref 31.5–35.7)
MCV: 92 fL (ref 79–97)
Platelets: 305 10*3/uL (ref 150–450)
RBC: 4.39 x10E6/uL (ref 3.77–5.28)
RDW: 11.8 % (ref 11.7–15.4)
WBC: 5.9 10*3/uL (ref 3.4–10.8)

## 2019-04-28 LAB — COMPREHENSIVE METABOLIC PANEL
ALT: 19 IU/L (ref 0–32)
AST: 19 IU/L (ref 0–40)
Albumin/Globulin Ratio: 2.8 — ABNORMAL HIGH (ref 1.2–2.2)
Albumin: 4.8 g/dL (ref 3.8–4.8)
Alkaline Phosphatase: 38 IU/L — ABNORMAL LOW (ref 39–117)
BUN/Creatinine Ratio: 15 (ref 9–23)
BUN: 12 mg/dL (ref 6–24)
Bilirubin Total: 1 mg/dL (ref 0.0–1.2)
CO2: 21 mmol/L (ref 20–29)
Calcium: 9.4 mg/dL (ref 8.7–10.2)
Chloride: 104 mmol/L (ref 96–106)
Creatinine, Ser: 0.79 mg/dL (ref 0.57–1.00)
GFR calc Af Amer: 103 mL/min/{1.73_m2} (ref >59)
GFR calc non Af Amer: 89 mL/min/{1.73_m2} (ref >59)
Globulin, Total: 1.7 g/dL (ref 1.5–4.5)
Glucose: 88 mg/dL (ref 65–99)
Potassium: 4.4 mmol/L (ref 3.5–5.2)
Sodium: 140 mmol/L (ref 134–144)
Total Protein: 6.5 g/dL (ref 6.0–8.5)

## 2019-04-28 LAB — LIPID PANEL
Chol/HDL Ratio: 2.9 ratio (ref 0.0–4.4)
Cholesterol, Total: 206 mg/dL — ABNORMAL HIGH (ref 100–199)
HDL: 71 mg/dL (ref >39)
LDL Chol Calc (NIH): 122 mg/dL — ABNORMAL HIGH (ref 0–99)
Triglycerides: 75 mg/dL (ref 0–149)
VLDL Cholesterol Cal: 13 mg/dL (ref 5–40)

## 2019-04-28 LAB — VITAMIN D 25 HYDROXY (VIT D DEFICIENCY, FRACTURES): Vit D, 25-Hydroxy: 27 ng/mL — ABNORMAL LOW (ref 30.0–100.0)

## 2019-04-28 LAB — TSH: TSH: 1.2 u[IU]/mL (ref 0.450–4.500)

## 2019-05-02 LAB — CYTOLOGY - PAP
Comment: NEGATIVE
Diagnosis: NEGATIVE
High risk HPV: POSITIVE — AB

## 2019-05-03 ENCOUNTER — Other Ambulatory Visit: Payer: Self-pay

## 2019-05-03 DIAGNOSIS — B977 Papillomavirus as the cause of diseases classified elsewhere: Secondary | ICD-10-CM

## 2019-05-09 NOTE — Progress Notes (Signed)
GYNECOLOGY  VISIT   HPI: 48 y.o.   Married White or Caucasian Not Hispanic or Latino  female   G0P0000 with Patient's last menstrual period was 04/22/2019 (exact date).   here for colposcopy.    H/O CIN I in 12/18. Last year ASCUS +HPV pap, colpo unsatisfactory, ECC negative. Pap from earlier this month was negative with +HPV. She has questions about her mid cycle bleeding. Sometimes she will spot lightly for a day midcycle, sometimes it feels like she has a short cycle. Not every month. We did discuss this at her annual exam.   GYNECOLOGIC HISTORY: Patient's last menstrual period was 04/22/2019 (exact date). Contraception: Female partner Menopausal hormone therapy: None        OB History    Gravida  0   Para  0   Term  0   Preterm  0   AB  0   Living  0     SAB  0   TAB  0   Ectopic  0   Multiple  0   Live Births  0              Patient Active Problem List   Diagnosis Date Noted  . Anxiety 06/05/2018  . Family Hx of alcoholism, both parents 06/05/2018  . Irregular menses 06/05/2018  . Abnormal sensation of leg 06/05/2018  . Hyperbilirubinemia 06/05/2018  . Hx of abnormal cervical Pap smear, followed by GYN 06/05/2018  . Fibrocystic breast changes 06/05/2018  . Fatigue 07/10/2017  . Elevated bilirubin 07/10/2017  . Vitamin D deficiency 07/10/2017    Past Medical History:  Diagnosis Date  . ACL (anterior cruciate ligament) tear 10/18/1986  . Chickenpox   . Elevated bilirubin 07/10/2017  . Family Hx of alcoholism, both parents 06/05/2018  . Genital warts   . Hyperbilirubinemia 06/05/2018  . Irregular menses 06/05/2018  . Vitamin D deficiency 07/10/2017    Past Surgical History:  Procedure Laterality Date  . ANTERIOR CRUCIATE LIGAMENT REPAIR Right 1989  . BREAST SURGERY  04/18/2017  . VARICOSE VEIN SURGERY Right     Current Outpatient Medications  Medication Sig Dispense Refill  . acetaminophen (TYLENOL) 325 MG tablet Take 650 mg by mouth as needed.     Marland Kitchen ibuprofen (ADVIL,MOTRIN) 200 MG tablet Take 200 mg by mouth every 6 (six) hours as needed.    Marland Kitchen Specialty Vitamins Products (WOMENS VITA PAK PO) Take by mouth.     No current facility-administered medications for this visit.     ALLERGIES: Patient has no known allergies.  Family History  Problem Relation Age of Onset  . Alcohol abuse Father   . Diabetes Father   . Heart Problems Father   . Stroke Father   . Alcohol abuse Mother   . COPD Mother   . Hypertension Mother   . Heart disease Paternal Grandmother   . Hyperlipidemia Paternal Grandmother   . Heart disease Paternal Grandfather   . Hyperlipidemia Paternal Grandfather   . Alcohol abuse Maternal Grandfather   . Hypertension Maternal Grandfather   . Lung cancer Maternal Grandfather     Social History   Socioeconomic History  . Marital status: Married    Spouse name: Anderson Malta  . Number of children: Not on file  . Years of education: Not on file  . Highest education level: Not on file  Occupational History  . Occupation: Oceanographer    Employer: [employer]   Tobacco Use  . Smoking status: Never Smoker  .  Smokeless tobacco: Former Systems developer    Types: Chew  Substance and Sexual Activity  . Alcohol use: Yes    Comment: occasion glass of wine  . Drug use: No  . Sexual activity: Yes    Partners: Female    Birth control/protection: None    Comment: female partner  Other Topics Concern  . Not on file  Social History Narrative   Active, but no regular exercise. Eats well, mostly Paleo. Likes to drink 1-2 glasses of wine or beer several days per week, but is mindful to watch this because of strong family history of alcoholism. No children, though she and her wife tried a few years ago. She is happy with current life without children, but admits that her wife struggles with it. 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 Admits that she "hates doctors" due to some poor experiences in the past.  It is important to her to feel "heard." Prefers not to take medications if possible, but open if needed. Okay for all preventive maintenance. Mother and father divorced when she was young. Father raised her, non-optimal environment, emotionally absent. She has a difficult time connecting with others, especially physically, but feels that she is an extrovert in that she recharges via time with friends.    Social Determinants of Health   Financial Resource Strain:   . Difficulty of Paying Living Expenses: Not asked  Food Insecurity:   . Worried About Charity fundraiser in the Last Year: Not asked  . Ran Out of Food in the Last Year: Not asked  Transportation Needs:   . Lack of Transportation (Medical): Not asked  . Lack of Transportation (Non-Medical): Not asked  Physical Activity:   . Days of Exercise per Week: Not on file  . Minutes of Exercise per Session: Not on file  Stress:   . Feeling of Stress : Not on file  Social Connections:   . Frequency of Communication with Friends and Family: Not on file  . Frequency of Social Gatherings with Friends and Family: Not on file  . Attends Religious Services: Not on file  . Active Member of Clubs or Organizations: Not on file  . Attends Archivist Meetings: Not on file  . Marital Status: Not on file  Intimate Partner Violence:   . Fear of Current or Ex-Partner: Not on file  . Emotionally Abused: Not on file  . Physically Abused: Not on file  . Sexually Abused: Not on file    Review of Systems  Constitutional: Negative.   HENT: Negative.   Eyes: Negative.   Respiratory: Negative.   Cardiovascular: Negative.   Gastrointestinal: Negative.   Genitourinary: Negative.   Musculoskeletal: Negative.   Skin: Negative.   Neurological: Negative.   Endo/Heme/Allergies: Negative.   Psychiatric/Behavioral: Negative.     PHYSICAL EXAMINATION:    BP 118/72 (BP Location: Right Arm, Patient Position: Sitting, Cuff Size: Normal)    Pulse 76   Temp 98.4 F (36.9 C) (Skin)   Wt 150 lb (68 kg)   LMP 04/22/2019 (Exact Date)   BMI 24.96 kg/m     General appearance: alert, cooperative and appears stated age  Pelvic: External genitalia:  no lesions              Urethra:  normal appearing urethra with no masses, tenderness or lesions              Bartholins and Skenes: normal  Vagina: normal appearing vagina with normal color and discharge, no lesions              Cervix:  No gross lesions. Mildly stenotic  Colposcopy: unsatisfactory, mild aceto-white changes at 12 o'clock, biopsy taken. ECC done. Negative lugols examination of the upper vagina.   Chaperone was present for exam.  ASSESSMENT +HPV, h/o CIN I in 2018 AUB    PLAN Colposcopy with biopsy and ECC done Discussed evaluation Discussed her bleeding, she will calendar her cycles and send in for review.    An After Visit Summary was printed and given to the patient.

## 2019-05-10 ENCOUNTER — Telehealth: Payer: Self-pay | Admitting: Obstetrics and Gynecology

## 2019-05-10 NOTE — Telephone Encounter (Signed)
Call placed to convey benefits for colposcopy. °

## 2019-05-11 NOTE — Telephone Encounter (Signed)
Return call to patient left message to return my call for benefits.

## 2019-05-11 NOTE — Telephone Encounter (Signed)
Spoke with patient and conveyed benefits. She understands/agreeable with the benefits. Patient is aware of the cancellation policy. Appointment scheduled 05/16/19.

## 2019-05-11 NOTE — Telephone Encounter (Signed)
Patient returned a call to Rosa. 

## 2019-05-16 ENCOUNTER — Encounter: Payer: Self-pay | Admitting: Obstetrics and Gynecology

## 2019-05-16 ENCOUNTER — Other Ambulatory Visit (HOSPITAL_COMMUNITY)
Admission: RE | Admit: 2019-05-16 | Discharge: 2019-05-16 | Disposition: A | Discharge status: Home / Self Care | Payer: 59 | Source: Ambulatory Visit | Attending: Obstetrics and Gynecology | Admitting: Obstetrics and Gynecology

## 2019-05-16 ENCOUNTER — Ambulatory Visit (INDEPENDENT_AMBULATORY_CARE_PROVIDER_SITE_OTHER): Payer: 59 | Admitting: Obstetrics and Gynecology

## 2019-05-16 ENCOUNTER — Other Ambulatory Visit: Payer: Self-pay

## 2019-05-16 VITALS — BP 118/72 | HR 76 | Temp 98.4°F | Wt 150.0 lb

## 2019-05-16 DIAGNOSIS — R87810 Cervical high risk human papillomavirus (HPV) DNA test positive: Secondary | ICD-10-CM | POA: Insufficient documentation

## 2019-05-16 DIAGNOSIS — D260 Other benign neoplasm of cervix uteri: Secondary | ICD-10-CM | POA: Diagnosis not present

## 2019-05-16 DIAGNOSIS — Z87410 Personal history of cervical dysplasia: Secondary | ICD-10-CM

## 2019-05-16 DIAGNOSIS — N939 Abnormal uterine and vaginal bleeding, unspecified: Secondary | ICD-10-CM | POA: Diagnosis not present

## 2019-05-16 DIAGNOSIS — B977 Papillomavirus as the cause of diseases classified elsewhere: Secondary | ICD-10-CM | POA: Diagnosis not present

## 2019-05-16 NOTE — Patient Instructions (Signed)

## 2019-05-18 LAB — SURGICAL PATHOLOGY

## 2019-05-23 ENCOUNTER — Other Ambulatory Visit: Payer: Self-pay

## 2019-05-23 DIAGNOSIS — N939 Abnormal uterine and vaginal bleeding, unspecified: Secondary | ICD-10-CM

## 2019-05-23 DIAGNOSIS — B977 Papillomavirus as the cause of diseases classified elsewhere: Secondary | ICD-10-CM

## 2019-05-23 MED ORDER — MISOPROSTOL 200 MCG PO TABS: ORAL_TABLET | ORAL | 0 refills | Status: DC

## 2019-05-24 IMAGING — US US ABDOMEN LIMITED
1 series · 14 of 25 positions shown · non-contrast
Comparison: None.

CLINICAL DATA: Fatigue over 1 year.  Elevated bilirubin

EXAM:
ULTRASOUND ABDOMEN LIMITED RIGHT UPPER QUADRANT

[Series 1: us abdomen limited · 0.19mm/px · 14 of 41 slices shown]
[im 1/41]
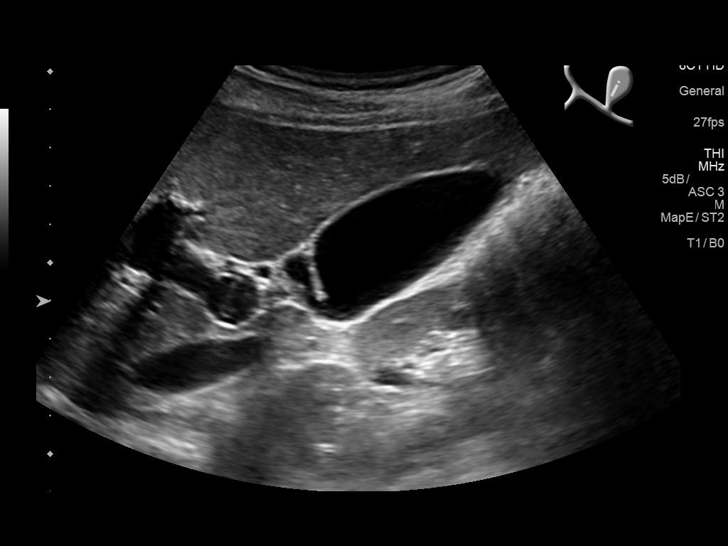
[im 4/41]
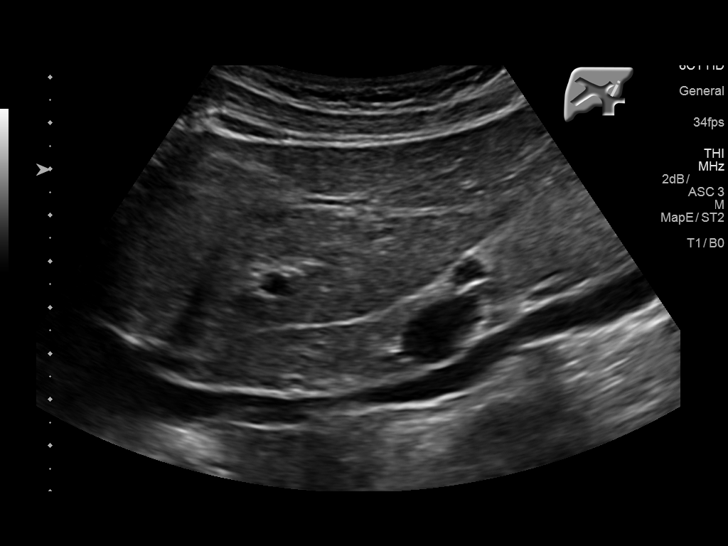
[im 7/41]
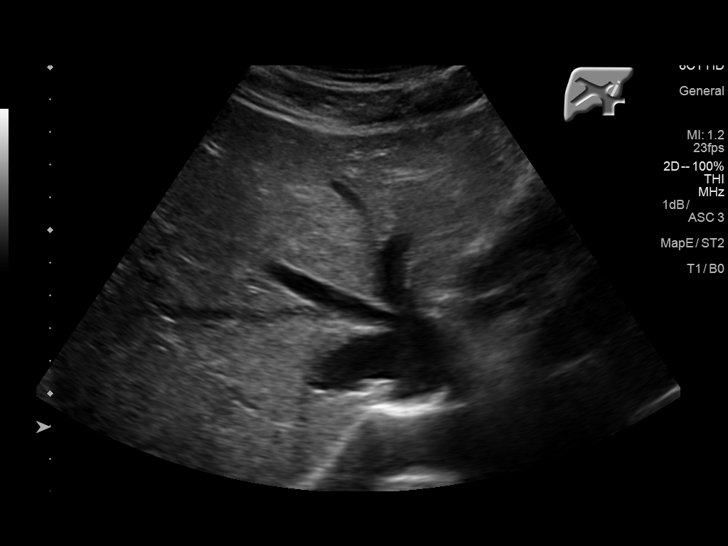
[im 11/41]
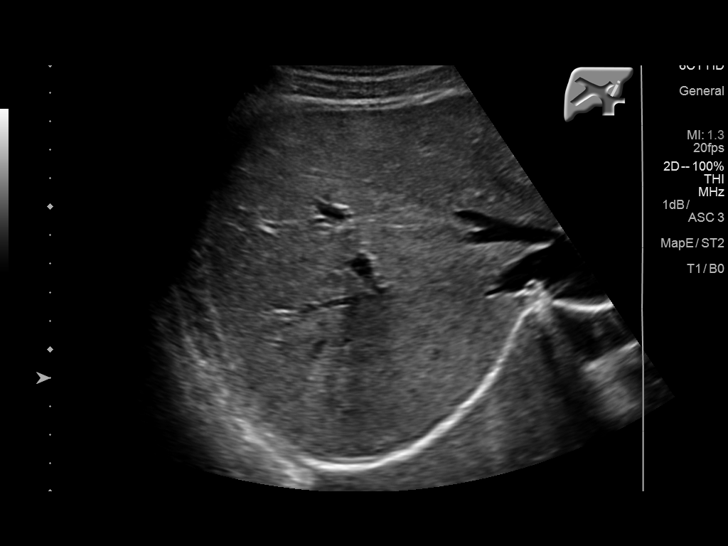
[im 14/41]
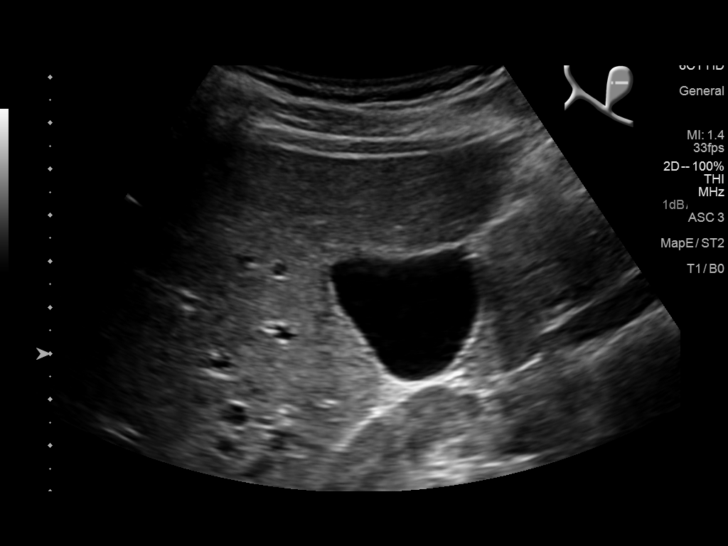
[im 16/41]
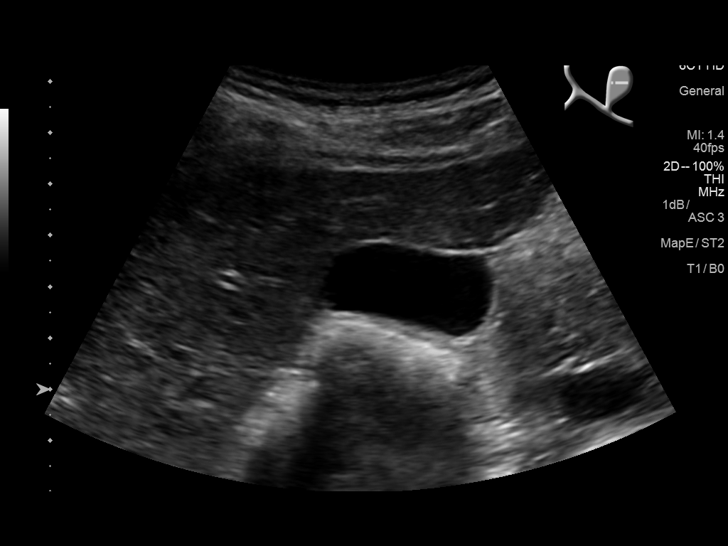
[im 19/41]
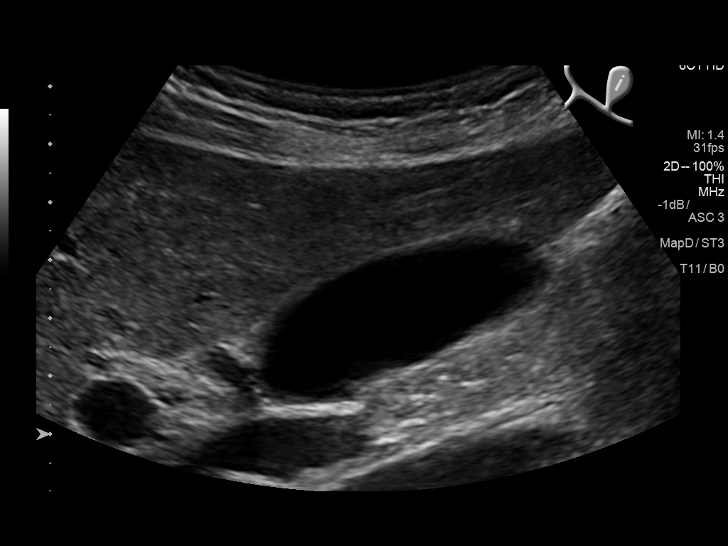
[im 22/41]
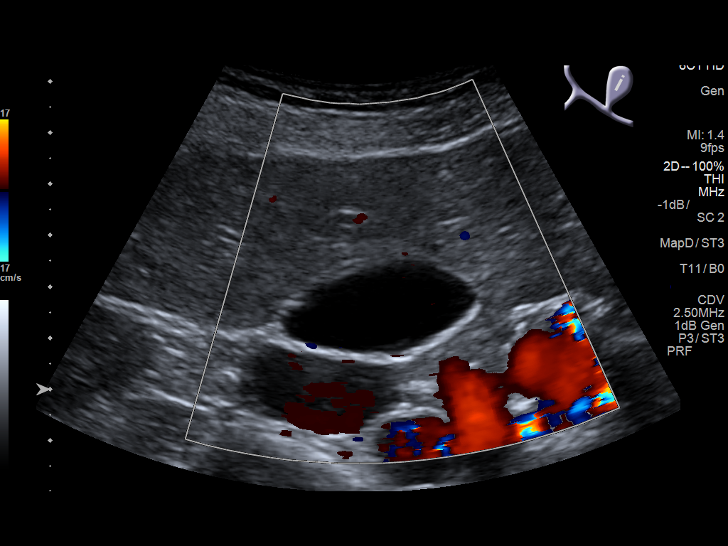
[im 26/41]
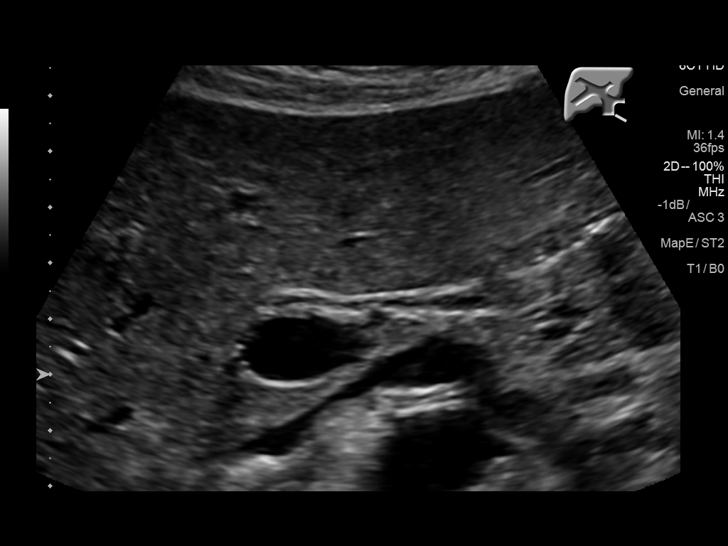
[im 27/41]
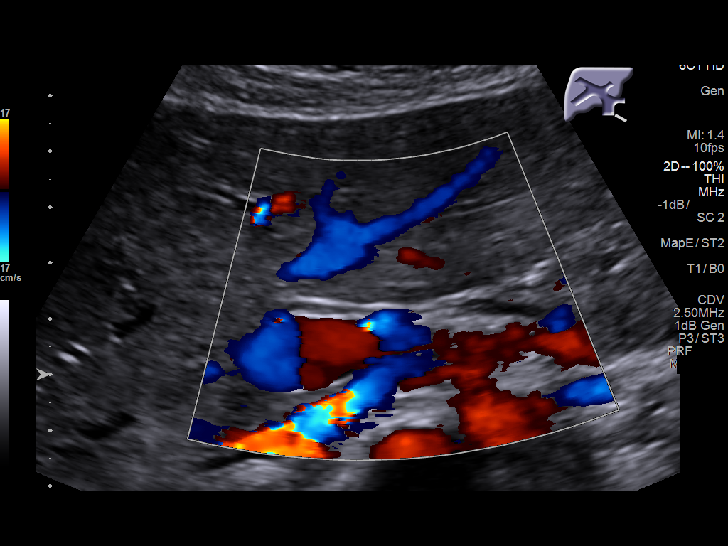
[im 31/41]
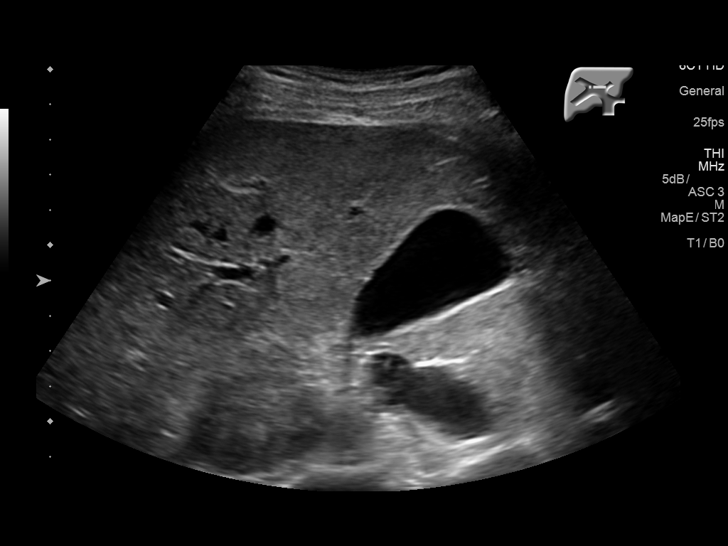
[im 34/41]
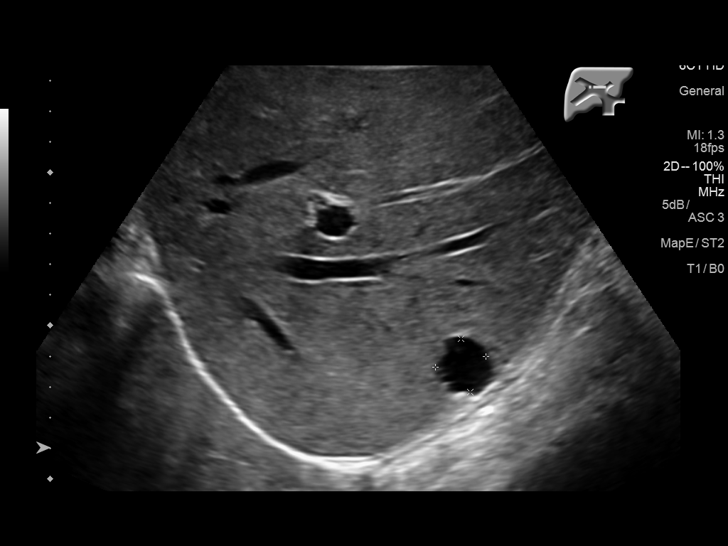
[im 37/41]
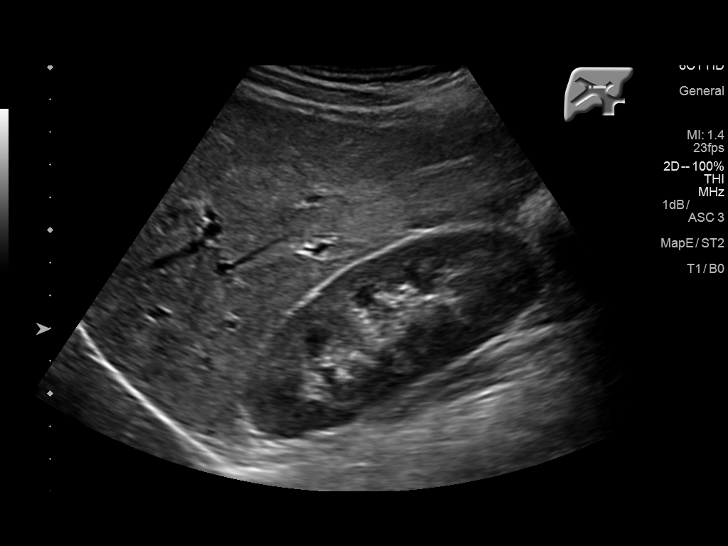
[im 41/41]
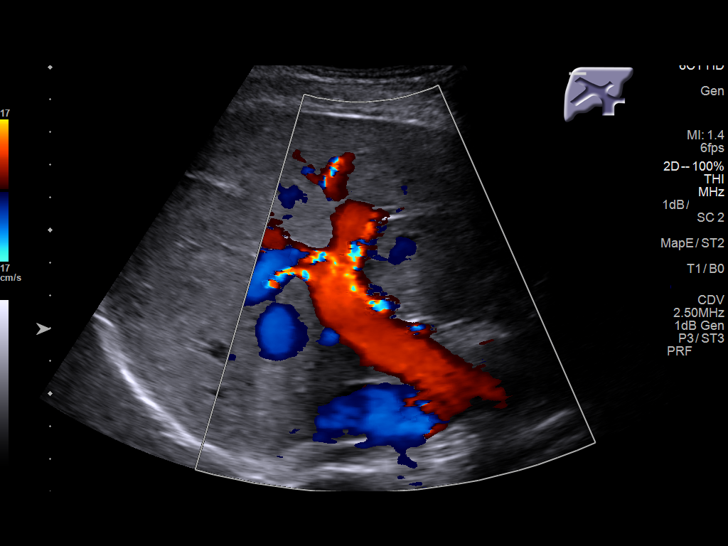

[14 of 25 positions shown; findings below may reference images not displayed]

FINDINGS: Gallbladder:

No gallstones or wall thickening visualized. No sonographic Murphy
sign noted by sonographer.

Common bile duct:

Diameter: Normal at 3 mm

Liver:

Anechoic cyst in the RIGHT hepatic lobe measuring 1.7 cm. Within
normal limits in parenchymal echogenicity. Portal vein is patent on
color Doppler imaging with normal direction of blood flow towards
the liver.
IMPRESSION: 1. Normal gallbladder and biliary tree.
2. Benign appearing cyst RIGHT hepatic lobe.
3. Normal liver parenchyma

## 2019-05-31 ENCOUNTER — Telehealth: Payer: Self-pay | Admitting: Obstetrics and Gynecology

## 2019-05-31 NOTE — Telephone Encounter (Signed)
Call placed to get new insurance information for schedule procedure appointment.

## 2019-05-31 NOTE — Addendum Note (Signed)
Addended by: Georgia Lopes on: 05/31/2019 03:50 PM   Modules accepted: Orders

## 2019-05-31 NOTE — Progress Notes (Signed)
Orders placed for ECC.

## 2019-06-07 NOTE — Telephone Encounter (Signed)
Call placed to convey benefits for ECC. Left detailed benefits per patients request.

## 2019-06-10 ENCOUNTER — Other Ambulatory Visit (HOSPITAL_COMMUNITY)
Admission: RE | Admit: 2019-06-10 | Discharge: 2019-06-10 | Disposition: A | Discharge status: Home / Self Care | Payer: Commercial Managed Care - PPO | Source: Ambulatory Visit | Attending: Obstetrics and Gynecology | Admitting: Obstetrics and Gynecology

## 2019-06-10 ENCOUNTER — Other Ambulatory Visit: Payer: Self-pay

## 2019-06-10 ENCOUNTER — Ambulatory Visit (INDEPENDENT_AMBULATORY_CARE_PROVIDER_SITE_OTHER): Payer: Commercial Managed Care - PPO | Admitting: Obstetrics and Gynecology

## 2019-06-10 ENCOUNTER — Encounter: Payer: Self-pay | Admitting: Obstetrics and Gynecology

## 2019-06-10 VITALS — BP 100/60 | HR 67 | Temp 99.1°F | Ht 64.0 in | Wt 147.0 lb

## 2019-06-10 DIAGNOSIS — R102 Pelvic and perineal pain: Secondary | ICD-10-CM

## 2019-06-10 DIAGNOSIS — R87810 Cervical high risk human papillomavirus (HPV) DNA test positive: Secondary | ICD-10-CM | POA: Insufficient documentation

## 2019-06-10 DIAGNOSIS — D260 Other benign neoplasm of cervix uteri: Secondary | ICD-10-CM | POA: Insufficient documentation

## 2019-06-10 DIAGNOSIS — B977 Papillomavirus as the cause of diseases classified elsewhere: Secondary | ICD-10-CM

## 2019-06-10 DIAGNOSIS — N939 Abnormal uterine and vaginal bleeding, unspecified: Secondary | ICD-10-CM | POA: Diagnosis not present

## 2019-06-10 NOTE — Progress Notes (Signed)
GYNECOLOGY  VISIT   HPI: 49 y.o.   Married White or Caucasian Not Hispanic or Latino  female   G0P0000 with Patient's last menstrual period was 05/22/2019 (exact date).   here for an ECC after colpo on 05/16/19   She states that she saw a few drops of blood in to toilet this morning. Reports taking Cytotec at 11:30 pm last night. She has had some "pretty bad" cramping.  H/O CIN I in 12/18. Last year ASCUS +HPV pap, colpo unsatisfactory, ECC negative. Pap from last month was negative with +HPV. Colposcopy last month was unsatisfactory. Cervix was mildly stenotic. Cervical biopsy was negative, ECC was negative but scant.  GYNECOLOGIC HISTORY: Patient's last menstrual period was 05/22/2019 (exact date). Contraception:Female partner Menopausal hormone therapy: none        OB History    Gravida  0   Para  0   Term  0   Preterm  0   AB  0   Living  0     SAB  0   TAB  0   Ectopic  0   Multiple  0   Live Births  0              Patient Active Problem List   Diagnosis Date Noted  . Anxiety 06/05/2018  . Family Hx of alcoholism, both parents 06/05/2018  . Irregular menses 06/05/2018  . Abnormal sensation of leg 06/05/2018  . Hyperbilirubinemia 06/05/2018  . Hx of abnormal cervical Pap smear, followed by GYN 06/05/2018  . Fibrocystic breast changes 06/05/2018  . Fatigue 07/10/2017  . Elevated bilirubin 07/10/2017  . Vitamin D deficiency 07/10/2017    Past Medical History:  Diagnosis Date  . ACL (anterior cruciate ligament) tear 10/18/1986  . Chickenpox   . Elevated bilirubin 07/10/2017  . Family Hx of alcoholism, both parents 06/05/2018  . Genital warts   . Hyperbilirubinemia 06/05/2018  . Irregular menses 06/05/2018  . Vitamin D deficiency 07/10/2017    Past Surgical History:  Procedure Laterality Date  . ANTERIOR CRUCIATE LIGAMENT REPAIR Right 1989  . BREAST SURGERY  04/18/2017  . VARICOSE VEIN SURGERY Right     Current Outpatient Medications  Medication  Sig Dispense Refill  . acetaminophen (TYLENOL) 325 MG tablet Take 650 mg by mouth as needed.    Marland Kitchen ibuprofen (ADVIL,MOTRIN) 200 MG tablet Take 200 mg by mouth every 6 (six) hours as needed.    . misoprostol (CYTOTEC) 200 MCG tablet Place 2 tablets vaginally 6-12 hours prior to procedure. 2 tablet 0  . Specialty Vitamins Products (WOMENS VITA PAK PO) Take by mouth.     No current facility-administered medications for this visit.     ALLERGIES: Patient has no known allergies.  Family History  Problem Relation Age of Onset  . Alcohol abuse Father   . Diabetes Father   . Heart Problems Father   . Stroke Father   . Alcohol abuse Mother   . COPD Mother   . Hypertension Mother   . Heart disease Paternal Grandmother   . Hyperlipidemia Paternal Grandmother   . Heart disease Paternal Grandfather   . Hyperlipidemia Paternal Grandfather   . Alcohol abuse Maternal Grandfather   . Hypertension Maternal Grandfather   . Lung cancer Maternal Grandfather     Social History   Socioeconomic History  . Marital status: Married    Spouse name: Anderson Malta  . Number of children: Not on file  . Years of education: Not on file  .  Highest education level: Not on file  Occupational History  . Occupation: Oceanographer    Employer: [employer]   Tobacco Use  . Smoking status: Never Smoker  . Smokeless tobacco: Former Systems developer    Types: Chew  Substance and Sexual Activity  . Alcohol use: Yes    Comment: occasion glass of wine  . Drug use: No  . Sexual activity: Yes    Partners: Female    Birth control/protection: None    Comment: female partner  Other Topics Concern  . Not on file  Social History Narrative   Active, but no regular exercise. Eats well, mostly Paleo. Likes to drink 1-2 glasses of wine or beer several days per week, but is mindful to watch this because of strong family history of alcoholism. No children, though she and her wife tried a few years ago. She is happy with current life  without children, but admits that her wife struggles with it. 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 Admits that she "hates doctors" due to some poor experiences in the past. It is important to her to feel "heard." Prefers not to take medications if possible, but open if needed. Okay for all preventive maintenance. Mother and father divorced when she was young. Father raised her, non-optimal environment, emotionally absent. She has a difficult time connecting with others, especially physically, but feels that she is an extrovert in that she recharges via time with friends.    Social Determinants of Health   Financial Resource Strain:   . Difficulty of Paying Living Expenses: Not asked  Food Insecurity:   . Worried About Charity fundraiser in the Last Year: Not asked  . Ran Out of Food in the Last Year: Not asked  Transportation Needs:   . Lack of Transportation (Medical): Not asked  . Lack of Transportation (Non-Medical): Not asked  Physical Activity:   . Days of Exercise per Week: Not on file  . Minutes of Exercise per Session: Not on file  Stress:   . Feeling of Stress : Not on file  Social Connections:   . Frequency of Communication with Friends and Family: Not on file  . Frequency of Social Gatherings with Friends and Family: Not on file  . Attends Religious Services: Not on file  . Active Member of Clubs or Organizations: Not on file  . Attends Archivist Meetings: Not on file  . Marital Status: Not on file  Intimate Partner Violence:   . Fear of Current or Ex-Partner: Not on file  . Emotionally Abused: Not on file  . Physically Abused: Not on file  . Sexually Abused: Not on file    Review of Systems  All other systems reviewed and are negative.   PHYSICAL EXAMINATION:    BP 100/60   Pulse 67   Temp 99.1 F (37.3 C)   Ht 5\' 4"  (1.626 m)   Wt 147 lb (66.7 kg)   LMP 05/22/2019 (Exact Date)   SpO2 98%   BMI 25.23 kg/m      General appearance: alert, cooperative and appears stated age  Pelvic: External genitalia:  no lesions              Urethra:  normal appearing urethra with no masses, tenderness or lesions              Bartholins and Skenes: normal  Vagina: normal appearing vagina with normal color and discharge, no lesions. No cytotec tablets left in the vagina.               Cervix:no lesions, not stenotic after use of cytotec  Chaperone was present for exam.  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 Colpo from last month was unsatisfactory, ECC was negative but scant. Cramping with cytotec. She took 800 mg of ibuprofen at 7 am which helped some    PLAN Repeat ECC done Recommended that she take ibuprofen around the clock for the next 24 hours Call with any concerns   An After Visit Summary was printed and given to the patient.

## 2019-06-13 LAB — SURGICAL PATHOLOGY

## 2020-04-26 ENCOUNTER — Encounter: Payer: Self-pay | Admitting: Obstetrics and Gynecology

## 2020-04-30 ENCOUNTER — Ambulatory Visit: Payer: 59 | Admitting: Obstetrics and Gynecology

## 2020-04-30 ENCOUNTER — Encounter: Payer: Self-pay | Admitting: Obstetrics and Gynecology

## 2020-11-01 NOTE — Progress Notes (Signed)
50 y.o. G0P0000 Married White or Caucasian Not Hispanic or Latino female here for annual exam.   Period Cycle (Days): 28 Period Duration (Days): 4 Period Pattern: Regular Menstrual Flow: Moderate, Light Dysmenorrhea: None She has intermittent mid cycle spotting. 1-2 x a year she seems to have an extra cycle.   She has some urgency to urinate, drinks a fair amount of caffeine. She can leak a couple of drops on the way to the bathroom when she is very full.   No bowel c/o.   Patient's last menstrual period was 10/12/2020.          Sexually active: Yes.    The current method of family planning is none.    Exercising: No.   Smoker:  no  Health Maintenance: Pap:  04-27-19 normal HR HPV POSITIVE. Colpo unsatisfactoy, ECC  06-10-19 neg History of abnormal Pap:  yes CIN I in 12/18. 2019 ASCUS +HPV pap, colpo unsatisfactory, ECC negative MMG:  04-26-20 normal BMD:   N/A Colonoscopy: N/A TDaP:  2020 Gardasil: No   reports that she has never smoked. She quit smokeless tobacco use about 28 years ago.  Her smokeless tobacco use included chew. She reports current alcohol use. She reports that she does not use drugs. She is an Chief Financial Officer, Risk manager   Past Medical History:  Diagnosis Date   ACL (anterior cruciate ligament) tear 10/18/1986   Chickenpox    Elevated bilirubin 07/10/2017   Family Hx of alcoholism, both parents 06/05/2018   Genital warts    Hyperbilirubinemia 06/05/2018   Irregular menses 06/05/2018   Vitamin D deficiency 07/10/2017    Past Surgical History:  Procedure Laterality Date   ANTERIOR CRUCIATE LIGAMENT REPAIR Right 1989   BREAST SURGERY  04/18/2017   VARICOSE VEIN SURGERY Right     Current Outpatient Medications  Medication Sig Dispense Refill   acetaminophen (TYLENOL) 325 MG tablet Take 650 mg by mouth as needed.     ibuprofen (ADVIL,MOTRIN) 200 MG tablet Take 200 mg by mouth every 6 (six) hours as needed.     No current facility-administered  medications for this visit.    Family History  Problem Relation Age of Onset   Heart Problems Mother    Alcohol abuse Mother    COPD Mother    Hypertension Mother    Alcohol abuse Father    Diabetes Father    Heart Problems Father    Stroke Father    Alcohol abuse Maternal Grandfather    Hypertension Maternal Grandfather    Lung cancer Maternal Grandfather    Heart disease Paternal Grandmother    Hyperlipidemia Paternal Grandmother    Heart disease Paternal Grandfather    Hyperlipidemia Paternal Grandfather     Review of Systems  All other systems reviewed and are negative.  Exam:   BP 106/64 (BP Location: Right Arm, Patient Position: Sitting, Cuff Size: Normal)   Pulse 78   Ht 5' 3.75" (1.619 m)   Wt 146 lb (66.2 kg)   LMP 10/12/2020   SpO2 98%   BMI 25.26 kg/m   Weight change: @WEIGHTCHANGE @ Height:   Height: 5' 3.75" (161.9 cm)  Ht Readings from Last 3 Encounters:  11/08/20 5' 3.75" (1.619 m)  06/10/19 5\' 4"  (1.626 m)  04/27/19 5\' 5"  (1.651 m)    General appearance: alert, cooperative and appears stated age Head: Normocephalic, without obvious abnormality, atraumatic Neck: no adenopathy, supple, symmetrical, trachea midline and thyroid normal to inspection and palpation Lungs: clear to auscultation  bilaterally Cardiovascular: regular rate and rhythm Breasts:  bilaterally fibrocystic changes, more prominent in the  right breast, specifically nodular in the right breast between 7-8 o'clock and larger area of nodularity between 10-11 o'clock. Abdomen: soft, non-tender; non distended,  no masses,  no organomegaly Extremities: extremities normal, atraumatic, no cyanosis or edema Skin: Skin color, texture, turgor normal. No rashes or lesions Lymph nodes: Cervical, supraclavicular, and axillary nodes normal. No abnormal inguinal nodes palpated Neurologic: Grossly normal   Pelvic: External genitalia:  no lesions              Urethra:  normal appearing urethra with no  masses, tenderness or lesions              Bartholins and Skenes: normal                 Vagina: normal appearing vagina with normal color and discharge, no lesions              Cervix: no lesions               Bimanual Exam:  Uterus:  normal size, contour, position, consistency, mobility, non-tender and retroverted              Adnexa: no mass, fullness, tenderness               Rectovaginal: Confirms               Anus:  normal sphincter tone, no lesions  Caryn Bee chaperoned for the exam.  1. Well woman exam Discussed breast self exam Discussed calcium and vit D intake  2. Screening for cervical cancer - Cytology - PAP( [facility])  3. Colon cancer screening Reviewed options for screening - Ambulatory referral to Gastroenterology  4. Laboratory exam ordered as part of routine general medical examination - CBC - Comprehensive metabolic panel - Lipid panel  5. Vitamin D deficiency - VITAMIN D 25 Hydroxy (Vit-D Deficiency, Fractures)  6. Fibrocystic changes of right breast Bilateral changes, but much more prominent on the right. Specifically nodular at 7-8 o'clock and at 10-11 o'clock She will return for a repeat breast exam after her cycle.

## 2020-11-08 ENCOUNTER — Ambulatory Visit (INDEPENDENT_AMBULATORY_CARE_PROVIDER_SITE_OTHER): Payer: Commercial Managed Care - PPO | Admitting: Obstetrics and Gynecology

## 2020-11-08 ENCOUNTER — Other Ambulatory Visit: Payer: Self-pay

## 2020-11-08 ENCOUNTER — Other Ambulatory Visit (HOSPITAL_COMMUNITY)
Admission: RE | Admit: 2020-11-08 | Discharge: 2020-11-08 | Disposition: A | Discharge status: Home / Self Care | Payer: Commercial Managed Care - PPO | Source: Ambulatory Visit | Attending: Obstetrics and Gynecology | Admitting: Obstetrics and Gynecology

## 2020-11-08 ENCOUNTER — Encounter: Payer: Self-pay | Admitting: Obstetrics and Gynecology

## 2020-11-08 VITALS — BP 106/64 | HR 78 | Ht 63.75 in | Wt 146.0 lb

## 2020-11-08 DIAGNOSIS — Z0000 Encounter for general adult medical examination without abnormal findings: Secondary | ICD-10-CM

## 2020-11-08 DIAGNOSIS — Z01419 Encounter for gynecological examination (general) (routine) without abnormal findings: Secondary | ICD-10-CM | POA: Diagnosis not present

## 2020-11-08 DIAGNOSIS — E559 Vitamin D deficiency, unspecified: Secondary | ICD-10-CM

## 2020-11-08 DIAGNOSIS — N6011 Diffuse cystic mastopathy of right breast: Secondary | ICD-10-CM

## 2020-11-08 DIAGNOSIS — Z124 Encounter for screening for malignant neoplasm of cervix: Secondary | ICD-10-CM | POA: Diagnosis present

## 2020-11-08 DIAGNOSIS — Z1211 Encounter for screening for malignant neoplasm of colon: Secondary | ICD-10-CM

## 2020-11-08 NOTE — Patient Instructions (Signed)

## 2020-11-09 LAB — COMPREHENSIVE METABOLIC PANEL
AG Ratio: 2.6 (calc) — ABNORMAL HIGH (ref 1.0–2.5)
ALT: 9 U/L (ref 6–29)
AST: 11 U/L (ref 10–35)
Albumin: 4.6 g/dL (ref 3.6–5.1)
Alkaline phosphatase (APISO): 32 U/L (ref 31–125)
BUN: 11 mg/dL (ref 7–25)
CO2: 25 mmol/L (ref 20–32)
Calcium: 9 mg/dL (ref 8.6–10.2)
Chloride: 104 mmol/L (ref 98–110)
Creat: 0.83 mg/dL (ref 0.50–1.10)
Globulin: 1.8 g/dL (calc) — ABNORMAL LOW (ref 1.9–3.7)
Glucose, Bld: 93 mg/dL (ref 65–99)
Potassium: 4 mmol/L (ref 3.5–5.3)
Sodium: 138 mmol/L (ref 135–146)
Total Bilirubin: 1.8 mg/dL — ABNORMAL HIGH (ref 0.2–1.2)
Total Protein: 6.4 g/dL (ref 6.1–8.1)

## 2020-11-09 LAB — CBC
HCT: 40.7 % (ref 35.0–45.0)
Hemoglobin: 13.5 g/dL (ref 11.7–15.5)
MCH: 31.6 pg (ref 27.0–33.0)
MCHC: 33.2 g/dL (ref 32.0–36.0)
MCV: 95.3 fL (ref 80.0–100.0)
MPV: 11.3 fL (ref 7.5–12.5)
Platelets: 273 10*3/uL (ref 140–400)
RBC: 4.27 10*6/uL (ref 3.80–5.10)
RDW: 11.6 % (ref 11.0–15.0)
WBC: 5.2 10*3/uL (ref 3.8–10.8)

## 2020-11-09 LAB — LIPID PANEL
Cholesterol: 199 mg/dL (ref <200)
HDL: 62 mg/dL (ref >=50)
LDL Cholesterol (Calc): 124 mg/dL (calc) — ABNORMAL HIGH
Non-HDL Cholesterol (Calc): 137 mg/dL (calc) — ABNORMAL HIGH (ref <130)
Total CHOL/HDL Ratio: 3.2 (calc) (ref <5.0)
Triglycerides: 42 mg/dL (ref <150)

## 2020-11-09 LAB — VITAMIN D 25 HYDROXY (VIT D DEFICIENCY, FRACTURES): Vit D, 25-Hydroxy: 34 ng/mL (ref 30–100)

## 2020-11-12 LAB — CYTOLOGY - PAP
Comment: NEGATIVE
Diagnosis: NEGATIVE
High risk HPV: POSITIVE — AB

## 2020-11-20 ENCOUNTER — Other Ambulatory Visit: Payer: Self-pay

## 2020-11-20 ENCOUNTER — Encounter: Payer: Self-pay | Admitting: Obstetrics and Gynecology

## 2020-11-20 ENCOUNTER — Ambulatory Visit (INDEPENDENT_AMBULATORY_CARE_PROVIDER_SITE_OTHER): Payer: Commercial Managed Care - PPO | Admitting: Obstetrics and Gynecology

## 2020-11-20 VITALS — BP 116/64 | HR 68 | Ht 64.0 in | Wt 147.0 lb

## 2020-11-20 DIAGNOSIS — N6313 Unspecified lump in the right breast, lower outer quadrant: Secondary | ICD-10-CM

## 2020-11-20 NOTE — Progress Notes (Addendum)
GYNECOLOGY  VISIT   HPI: 50 y.o.   Married White or Caucasian Not Hispanic or Latino  female   G0P0000 with Patient's last menstrual period was 11/09/2020.   here for a f/u breast exam.  At her annual exam on 11/08/20 she was noted to have bilateral fibrocystic breast changes, more prominent on the right between 7-8 o'clock and 10-11 o'clock. She presents after her cycle for a repeat exam. Last mammogram on 04/29/20 was benign, category D  GYNECOLOGIC HISTORY: Patient's last menstrual period was 11/09/2020. Contraception:none  Menopausal hormone therapy: none         OB History     Gravida  0   Para  0   Term  0   Preterm  0   AB  0   Living  0      SAB  0   IAB  0   Ectopic  0   Multiple  0   Live Births  0              Patient Active Problem List   Diagnosis Date Noted   Anxiety 06/05/2018   Family Hx of alcoholism, both parents 06/05/2018   Irregular menses 06/05/2018   Abnormal sensation of leg 06/05/2018   Hyperbilirubinemia 06/05/2018   Hx of abnormal cervical Pap smear, followed by GYN 06/05/2018   Fibrocystic breast changes 06/05/2018   Fatigue 07/10/2017   Elevated bilirubin 07/10/2017   Vitamin D deficiency 07/10/2017    Past Medical History:  Diagnosis Date   ACL (anterior cruciate ligament) tear 10/18/1986   Chickenpox    Elevated bilirubin 07/10/2017   Family Hx of alcoholism, both parents 06/05/2018   Genital warts    Hyperbilirubinemia 06/05/2018   Irregular menses 06/05/2018   Vitamin D deficiency 07/10/2017    Past Surgical History:  Procedure Laterality Date   ANTERIOR CRUCIATE LIGAMENT REPAIR Right 1989   BREAST SURGERY  04/18/2017   VARICOSE VEIN SURGERY Right     Current Outpatient Medications  Medication Sig Dispense Refill   acetaminophen (TYLENOL) 325 MG tablet Take 650 mg by mouth as needed.     ibuprofen (ADVIL,MOTRIN) 200 MG tablet Take 200 mg by mouth every 6 (six) hours as needed.     No current  facility-administered medications for this visit.     ALLERGIES: Patient has no known allergies.  Family History  Problem Relation Age of Onset   Heart Problems Mother    Alcohol abuse Mother    COPD Mother    Hypertension Mother    Alcohol abuse Father    Diabetes Father    Heart Problems Father    Stroke Father    Alcohol abuse Maternal Grandfather    Hypertension Maternal Grandfather    Lung cancer Maternal Grandfather    Heart disease Paternal Grandmother    Hyperlipidemia Paternal Grandmother    Heart disease Paternal Grandfather    Hyperlipidemia Paternal Grandfather     Social History   Socioeconomic History   Marital status: Married    Spouse name: Anderson Malta   Number of children: Not on file   Years of education: Not on file   Highest education level: Not on file  Occupational History   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 Eats well, mostly Paleo. Likes to drink 1-2 glasses of wine or beer several days per week, but is mindful to watch this because of strong family history of alcoholism. No children, though she and her wife tried a few years ago. She is happy with current life without children, but admits that her wife struggles with it. 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 Admits that she "hates doctors" due to some poor experiences in the past. It is important to her to feel "heard." Prefers not to take medications if possible, but open if needed. Okay for all preventive  maintenance. Mother and father divorced when she was young. Father raised her, non-optimal environment, emotionally absent. She has a difficult time connecting with others, especially physically, but feels that she is an extrovert in that she recharges via time with friends.    Social Determinants of Health   Financial Resource Strain: Not on file  Food Insecurity: Not on file  Transportation Needs: Not on file  Physical Activity: Not on file  Stress: Not on file  Social Connections: Not on file  Intimate Partner Violence: Not on file    ROS  PHYSICAL EXAMINATION:    BP 116/64   Pulse 68   Ht 5\' 4"  (1.626 m)   Wt 147 lb (66.7 kg)   LMP 11/09/2020   SpO2 99%   BMI 25.23 kg/m     General appearance: alert, cooperative and appears stated age Breasts:  in the right breast at 8 o'clock is a persistent, tender, bean shaped, 2 cm lump.  No axillary adenopathy.  1. Breast lump on right side at 8 o'clock position Will set up diagnostic imaging for the left breast at [facility].   Addendum: Breast imaging revealed a simple cyst in the region of palpable concern. No f/u needed

## 2020-11-20 NOTE — Progress Notes (Signed)
Error

## 2020-11-21 ENCOUNTER — Telehealth: Payer: Self-pay | Admitting: *Deleted

## 2020-11-21 NOTE — Telephone Encounter (Signed)
-----   Message from Salvadore Dom, MD sent at 11/20/2020  1:59 PM EDT ----- Please set this patient up for diagnostic right breast imaging, lump at 8 o'clock.  Thanks, Sharee Pimple

## 2020-11-21 NOTE — Telephone Encounter (Signed)
Patient scheduled on 11/27/20 @ 7:45am, order faxed to [facility]. Patient aware of time and date.

## 2020-11-27 ENCOUNTER — Encounter: Payer: Self-pay | Admitting: Obstetrics and Gynecology

## 2020-12-19 ENCOUNTER — Encounter (HOSPITAL_COMMUNITY): Payer: Self-pay | Admitting: *Deleted

## 2020-12-19 ENCOUNTER — Emergency Department (HOSPITAL_COMMUNITY): Payer: Commercial Managed Care - PPO

## 2020-12-19 ENCOUNTER — Ambulatory Visit (HOSPITAL_COMMUNITY)
Admission: EM | Admit: 2020-12-19 | Discharge: 2020-12-19 | Disposition: A | Discharge status: Other Acute Inpatient Hospital | Payer: Commercial Managed Care - PPO | Attending: Student | Admitting: Student

## 2020-12-19 ENCOUNTER — Emergency Department (HOSPITAL_COMMUNITY)
Admission: EM | Admit: 2020-12-19 | Discharge: 2020-12-19 | Disposition: A | Discharge status: Home / Self Care | Payer: Commercial Managed Care - PPO | Attending: Emergency Medicine | Admitting: Emergency Medicine

## 2020-12-19 ENCOUNTER — Encounter (HOSPITAL_COMMUNITY): Payer: Self-pay | Admitting: Emergency Medicine

## 2020-12-19 ENCOUNTER — Other Ambulatory Visit: Payer: Self-pay

## 2020-12-19 DIAGNOSIS — M25512 Pain in left shoulder: Secondary | ICD-10-CM | POA: Diagnosis not present

## 2020-12-19 DIAGNOSIS — R079 Chest pain, unspecified: Secondary | ICD-10-CM | POA: Diagnosis present

## 2020-12-19 DIAGNOSIS — Z87891 Personal history of nicotine dependence: Secondary | ICD-10-CM | POA: Diagnosis not present

## 2020-12-19 DIAGNOSIS — R9431 Abnormal electrocardiogram [ECG] [EKG]: Secondary | ICD-10-CM

## 2020-12-19 DIAGNOSIS — N9489 Other specified conditions associated with female genital organs and menstrual cycle: Secondary | ICD-10-CM | POA: Insufficient documentation

## 2020-12-19 LAB — I-STAT BETA HCG BLOOD, ED (MC, WL, AP ONLY): I-stat hCG, quantitative: <5 m[IU]/mL (ref <5)

## 2020-12-19 LAB — CBC WITH DIFFERENTIAL/PLATELET
Abs Immature Granulocytes: 0.03 10*3/uL (ref 0.00–0.07)
Basophils Absolute: 0.1 10*3/uL (ref 0.0–0.1)
Basophils Relative: 1 %
Eosinophils Absolute: 0 10*3/uL (ref 0.0–0.5)
Eosinophils Relative: 0 %
HCT: 41.5 % (ref 36.0–46.0)
Hemoglobin: 13.9 g/dL (ref 12.0–15.0)
Immature Granulocytes: 0 %
Lymphocytes Relative: 22 %
Lymphs Abs: 1.8 10*3/uL (ref 0.7–4.0)
MCH: 30.7 pg (ref 26.0–34.0)
MCHC: 33.5 g/dL (ref 30.0–36.0)
MCV: 91.6 fL (ref 80.0–100.0)
Monocytes Absolute: 0.3 10*3/uL (ref 0.1–1.0)
Monocytes Relative: 4 %
Neutro Abs: 6.2 10*3/uL (ref 1.7–7.7)
Neutrophils Relative %: 73 %
Platelets: 273 10*3/uL (ref 150–400)
RBC: 4.53 MIL/uL (ref 3.87–5.11)
RDW: 11.9 % (ref 11.5–15.5)
WBC: 8.4 10*3/uL (ref 4.0–10.5)
nRBC: 0 % (ref 0.0–0.2)

## 2020-12-19 LAB — COMPREHENSIVE METABOLIC PANEL
ALT: 14 U/L (ref 0–44)
AST: 14 U/L — ABNORMAL LOW (ref 15–41)
Albumin: 4.1 g/dL (ref 3.5–5.0)
Alkaline Phosphatase: 28 U/L — ABNORMAL LOW (ref 38–126)
Anion gap: 8 (ref 5–15)
BUN: 10 mg/dL (ref 6–20)
CO2: 24 mmol/L (ref 22–32)
Calcium: 9.1 mg/dL (ref 8.9–10.3)
Chloride: 105 mmol/L (ref 98–111)
Creatinine, Ser: 0.8 mg/dL (ref 0.44–1.00)
GFR, Estimated: >60 mL/min (ref >60)
Glucose, Bld: 111 mg/dL — ABNORMAL HIGH (ref 70–99)
Potassium: 3.6 mmol/L (ref 3.5–5.1)
Sodium: 137 mmol/L (ref 135–145)
Total Bilirubin: 1.3 mg/dL — ABNORMAL HIGH (ref 0.3–1.2)
Total Protein: 6.3 g/dL — ABNORMAL LOW (ref 6.5–8.1)

## 2020-12-19 LAB — TROPONIN I (HIGH SENSITIVITY)
Troponin I (High Sensitivity): <2 ng/L (ref <18)
Troponin I (High Sensitivity): <2 ng/L (ref <18)

## 2020-12-19 NOTE — ED Provider Notes (Addendum)
[facility]    CSN: [id number] Arrival date & time: 12/19/20  1347      History   Chief Complaint Chief Complaint  Patient presents with   Chest Pain    HPI Sue Lopez is a 50 y.o. female presenting with chest pain.  Medical history noncontributory.  Endorses throbbing and stabbing left-sided chest pain radiating to the back and down left arm for about 1 day.  She does have history of similar in the past related to stress.  Does endorse work stressors.  Describes current pain as constant and has not identified triggers. denies shortness of breath, dizziness, weakness, fevers, cough.  Denies family history of early cardiac death.  HPI  Past Medical History:  Diagnosis Date   ACL (anterior cruciate ligament) tear 10/18/1986   Chickenpox    Elevated bilirubin 07/10/2017   Family Hx of alcoholism, both parents 06/05/2018   Genital warts    Hyperbilirubinemia 06/05/2018   Irregular menses 06/05/2018   Vitamin D deficiency 07/10/2017    Patient Active Problem List   Diagnosis Date Noted   Anxiety 06/05/2018   Family Hx of alcoholism, both parents 06/05/2018   Irregular menses 06/05/2018   Abnormal sensation of leg 06/05/2018   Hyperbilirubinemia 06/05/2018   Hx of abnormal cervical Pap smear, followed by GYN 06/05/2018   Fibrocystic breast changes 06/05/2018   Fatigue 07/10/2017   Elevated bilirubin 07/10/2017   Vitamin D deficiency 07/10/2017    Past Surgical History:  Procedure Laterality Date   ANTERIOR CRUCIATE LIGAMENT REPAIR Right 1989   BREAST SURGERY  04/18/2017   VARICOSE VEIN SURGERY Right     OB History     Gravida  0   Para  0   Term  0   Preterm  0   AB  0   Living  0      SAB  0   IAB  0   Ectopic  0   Multiple  0   Live Births  0            Home Medications    Prior to Admission medications   Medication Sig Start Date End Date Taking? Authorizing Provider  acetaminophen (TYLENOL) 325 MG tablet Take 650 mg by mouth  as needed.    [provider]  ibuprofen (ADVIL,MOTRIN) 200 MG tablet Take 200 mg by mouth every 6 (six) hours as needed.    [provider]    Family History Family History  Problem Relation Age of Onset   Heart Problems Mother    Alcohol abuse Mother    COPD Mother    Hypertension Mother    Alcohol abuse Father    Diabetes Father    Heart Problems Father    Stroke Father    Alcohol abuse Maternal Grandfather    Hypertension Maternal Grandfather    Lung cancer Maternal Grandfather    Heart disease Paternal Grandmother    Hyperlipidemia Paternal Grandmother    Heart disease Paternal Grandfather    Hyperlipidemia Paternal Grandfather     Social History Social History   Tobacco Use   Smoking status: Never   Smokeless tobacco: Former    Types: Chew    Quit date: 06/04/1992  Vaping Use   Vaping Use: Never used  Substance Use Topics   Alcohol use: Yes    Comment: occasion glass of wine   Drug use: No     Allergies   Patient has no known allergies.   Review  of Systems Review of Systems  Cardiovascular:  Positive for chest pain.  All other systems reviewed and are negative.   Physical Exam Triage Vital Signs ED Triage Vitals  Enc Vitals Group     BP 12/19/20 1405 113/67     Pulse Rate 12/19/20 1405 85     Resp 12/19/20 1405 18     Temp 12/19/20 1405 99.2 F (37.3 C)     Temp src --      SpO2 12/19/20 1405 95 %     Weight --      Height --      Head Circumference --      Peak Flow --      Pain Score 12/19/20 1432 0     Pain Loc --      Pain Edu? --      Excl. in [place]? --    No data found.  Updated Vital Signs BP 113/67   Pulse 85   Temp 99.2 F (37.3 C)   Resp 18   LMP 12/09/2020   SpO2 95%   Visual Acuity Right Eye Distance:   Left Eye Distance:   Bilateral Distance:    Right Eye Near:   Left Eye Near:    Bilateral Near:     Physical Exam Vitals reviewed.  Constitutional:      Appearance: Normal appearance. She is  not diaphoretic.  HENT:     Head: Normocephalic and atraumatic.     Mouth/Throat:     Mouth: Mucous membranes are moist.  Eyes:     Extraocular Movements: Extraocular movements intact.     Pupils: Pupils are equal, round, and reactive to light.  Cardiovascular:     Rate and Rhythm: Normal rate and regular rhythm.     Pulses:          Radial pulses are 2+ on the right side and 2+ on the left side.     Heart sounds: Normal heart sounds.  Pulmonary:     Effort: Pulmonary effort is normal.     Breath sounds: Normal breath sounds.  Chest:     Chest wall: No tenderness.  Abdominal:     Palpations: Abdomen is soft.     Tenderness: There is no abdominal tenderness. There is no guarding or rebound.  Musculoskeletal:     Right lower leg: No edema.     Left lower leg: No edema.  Skin:    General: Skin is warm.     Capillary Refill: Capillary refill takes less than 2 seconds.  Neurological:     General: No focal deficit present.     Mental Status: She is alert and oriented to person, place, and time.  Psychiatric:        Mood and Affect: Mood normal.        Behavior: Behavior normal.        Thought Content: Thought content normal.        Judgment: Judgment normal.     UC Treatments / Results  Labs (all labs ordered are listed, but only abnormal results are displayed) Labs Reviewed - No data to display  EKG   Radiology No results found.  Procedures Procedures (including critical care time)  Medications Ordered in UC Medications - No data to display  Initial Impression / Assessment and Plan / UC Course  I have reviewed the triage vital signs and the nursing notes.  Pertinent labs & imaging results that were available during my care of the  patient were reviewed by me and considered in my medical decision making (see chart for details).     This patient is a very pleasant 50 y.o. year old female presenting with chest pain and atypical EKG. EKG with t-wave inversions,  changed from 2010 EKG. Radial pulses 2+ and equal, low suspicion of aortic dissection. I am recommending this patient head to the ED for further cardiac workup, including cardiac monitoring and troponins. Patient declines transport via EMS in favor of personal vehicle.  Final Clinical Impressions(s) / UC Diagnoses   Final diagnoses:  Chest pain, unspecified type  Nonspecific abnormal electrocardiogram (ECG) (EKG)     Discharge Instructions      ED personal vehicle- declines EMS     ED Prescriptions   None    PDMP not reviewed this encounter.   Hazel Sams, PA-C 12/19/20 1436    Hazel Sams, PA-C 12/19/20 [phone]

## 2020-12-19 NOTE — Discharge Instructions (Addendum)
The testing today did not show any serious problems.  The EKG we did was normal.  Your pain may be secondary to stress, with or without esophageal irritation from reflux or an acid condition.  Plans to try include an antacid before meals and at bedtime for a week.  You could consider trying Prilosec, but it takes several days to start working and you would need to take it for a whole month.  Follow-up with your new primary care doctor as scheduled for ongoing care.  See your current gynecologist if needed for problems or you can return here.

## 2020-12-19 NOTE — ED Provider Notes (Signed)
[facility] EMERGENCY DEPARTMENT Provider Note   CSN: [id number] Arrival date & time: 12/19/20  1439     History Chief Complaint  Patient presents with   Chest Pain    Sue Lopez is a 50 y.o. female.  HPI Here for evaluation of constant chest pain which she feels in the center of her chest and radiates through to her left scapular region.  This is been present for at least 3 days and was preceded by similar discomfort for several weeks.  Previously the discomfort would come and go.  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  No prior history of cardiac disease.  No nausea, vomiting, diaphoresis, palpitations, shortness of breath, weakness or dizziness.  She is here with her partner.  She went to an urgent care today and had an evaluation that included EKG.  EKG was felt to be abnormal showing T wave inversion.  I reviewed that EKG.  Patient has not had problems like this previously.  There are no other known active modifying factors.    Past Medical History:  Diagnosis Date   ACL (anterior cruciate ligament) tear 10/18/1986   Chickenpox    Elevated bilirubin 07/10/2017   Family Hx of alcoholism, both parents 06/05/2018   Genital warts    Hyperbilirubinemia 06/05/2018   Irregular menses 06/05/2018   Vitamin D deficiency 07/10/2017    Patient Active Problem List   Diagnosis Date Noted   Anxiety 06/05/2018   Family Hx of alcoholism, both parents 06/05/2018   Irregular menses 06/05/2018   Abnormal sensation of leg 06/05/2018   Hyperbilirubinemia 06/05/2018   Hx of abnormal cervical Pap smear, followed by GYN 06/05/2018   Fibrocystic breast changes 06/05/2018   Fatigue 07/10/2017   Elevated bilirubin 07/10/2017   Vitamin D deficiency 07/10/2017    Past Surgical History:  Procedure Laterality Date   ANTERIOR CRUCIATE LIGAMENT REPAIR Right 1989   BREAST SURGERY  04/18/2017   VARICOSE VEIN SURGERY Right      OB History      Gravida  0   Para  0   Term  0   Preterm  0   AB  0   Living  0      SAB  0   IAB  0   Ectopic  0   Multiple  0   Live Births  0           Family History  Problem Relation Age of Onset   Heart Problems Mother    Alcohol abuse Mother    COPD Mother    Hypertension Mother    Alcohol abuse Father    Diabetes Father    Heart Problems Father    Stroke Father    Alcohol abuse Maternal Grandfather    Hypertension Maternal Grandfather    Lung cancer Maternal Grandfather    Heart disease Paternal Grandmother    Hyperlipidemia Paternal Grandmother    Heart disease Paternal Grandfather    Hyperlipidemia Paternal Grandfather     Social History   Tobacco Use   Smoking status: Never   Smokeless tobacco: Former    Types: Chew    Quit date: 06/04/1992  Vaping Use   Vaping Use: Never used  Substance Use Topics   Alcohol use: Yes    Comment: occasion glass of wine   Drug use: No    Home Medications Prior to Admission medications   Medication Sig Start Date End Date  Taking? Authorizing Provider  acetaminophen (TYLENOL) 325 MG tablet Take 650 mg by mouth as needed.    [provider]  ibuprofen (ADVIL,MOTRIN) 200 MG tablet Take 200 mg by mouth every 6 (six) hours as needed.    [provider]    Allergies    Patient has no known allergies.  Review of Systems   Review of Systems  All other systems reviewed and are negative.  Physical Exam Updated Vital Signs BP 102/70   Pulse (!) 59   Temp 98.9 F (37.2 C) (Oral)   Resp 11   LMP 12/09/2020   SpO2 100%   Physical Exam Vitals and nursing note reviewed.  Constitutional:      General: She is not in acute distress.    Appearance: She is well-developed. She is not ill-appearing, toxic-appearing or diaphoretic.  HENT:     Head: Normocephalic and atraumatic.     Right Ear: External ear normal.     Left Ear: External ear normal.  Eyes:     Conjunctiva/sclera: Conjunctivae  normal.     Pupils: Pupils are equal, round, and reactive to light.  Neck:     Trachea: Phonation normal.  Cardiovascular:     Rate and Rhythm: Normal rate and regular rhythm.     Heart sounds: Normal heart sounds.  Pulmonary:     Effort: Pulmonary effort is normal.     Breath sounds: Normal breath sounds.  Abdominal:     General: There is no distension.     Palpations: Abdomen is soft.     Tenderness: There is no abdominal tenderness.  Musculoskeletal:        General: Normal range of motion.     Cervical back: Normal range of motion and neck supple.  Skin:    General: Skin is warm and dry.  Neurological:     Mental Status: She is alert and oriented to person, place, and time.     Cranial Nerves: No cranial nerve deficit.     Sensory: No sensory deficit.     Motor: No abnormal muscle tone.     Coordination: Coordination normal.  Psychiatric:        Mood and Affect: Mood normal.        Behavior: Behavior normal.        Thought Content: Thought content normal.        Judgment: Judgment normal.    ED Results / Procedures / Treatments   Labs (all labs ordered are listed, but only abnormal results are displayed) Labs Reviewed  COMPREHENSIVE METABOLIC PANEL - Abnormal; Notable for the following components:      Result Value   Glucose, Bld 111 (*)    Total Protein 6.3 (*)    AST 14 (*)    Alkaline Phosphatase 28 (*)    Total Bilirubin 1.3 (*)    All other components within normal limits  CBC WITH DIFFERENTIAL/PLATELET  I-STAT BETA HCG BLOOD, ED ([facility], [facility], [facility] ONLY)  TROPONIN I (HIGH SENSITIVITY)  TROPONIN I (HIGH SENSITIVITY)    EKG None   Date: 12/19/20  Rate: 86  Rhythm: normal sinus rhythm  QRS Axis: normal  PR and QT Intervals: normal  ST/T Wave abnormalities: normal  PR and QRS Conduction Disutrbances:none  Narrative Interpretation:   Old EKG Reviewed: changes noted, since last tracing earlier today, apparent anterior chest pain misplacement of V2 and V3, has  been corrected.  There is no abnormal T wave inversion at this time.  Radiology DG Chest 2 View  Result Date: 12/19/2020 CLINICAL DATA:  Shortness of breath, chest pain and shoulder pain in a nonsmoker. EXAM: CHEST - 2 VIEW COMPARISON:  None FINDINGS: Trachea is midline. Cardiomediastinal contours and hilar structures are normal. Lungs are clear. EKG leads sticker projects over the chest in the RIGHT upper chest. No sign of pleural effusion. On limited assessment no acute skeletal process. IMPRESSION: No active cardiopulmonary disease. Electronically Signed   By: Zetta Bills M.D.   On: 12/19/2020 16:05    Procedures Procedures   Medications Ordered in ED Medications - No data to display  ED Course  I have reviewed the triage vital signs and the nursing notes.  Pertinent labs & imaging results that were available during my care of the patient were reviewed by me and considered in my medical decision making (see chart for details).  Clinical Course as of 12/20/20 1202  Wed Dec 19, 2020  1905 Total Bilirubin(!): 1.3 [EW]    Clinical Course User Index [EW] Daleen Bo, MD   MDM Rules/Calculators/A&P                            Patient Vitals for the past 24 hrs:  BP Temp Temp src Pulse Resp SpO2  12/19/20 1900 109/72 -- -- 66 10 100 %  12/19/20 1845 102/73 -- -- (!) 58 17 100 %  12/19/20 1830 100/62 -- -- (!) 59 12 100 %  12/19/20 1815 102/70 -- -- (!) 59 11 100 %  12/19/20 1451 116/89 98.9 F (37.2 C) Oral 88 16 100 %    At the time of discharge -reevaluation with update and discussion. After initial assessment and treatment, an updated evaluation reveals she is comfortable and has no further complaints. Daleen Bo   Medical Decision Making:  This patient is presenting for evaluation of ongoing chest pain, which does require a range of treatment options, and is a complaint that involves a moderate risk of morbidity and mortality. The differential diagnoses include  ACS, chest wall pain, esophageal disorder, pulmonary disorder. I decided to review old records, and in summary middle-aged female, with history of chronic bilirubin elevation, but no cardiac pulmonary disorders.  I obtained additional historical information from her partner at the bedside.  Clinical Laboratory Tests Ordered, included CBC, Metabolic panel, and Pregnancy test.  Single troponin review indicates normal except mild T bilirubin elevation, improved from 1 month ago. Radiologic Tests Ordered, included chest x-ray.  I independently Visualized: Radiograph images, which show no infiltrate or edema  Cardiac Monitor Tracing which shows normal sinus rhythm    Critical Interventions-clinical evaluation, laboratory testing, observation and discussion with patient  After These Interventions, the Patient was reevaluated and was found stable for discharge.  Doubt ACS, PE or pneumonia.  Suspect esophagitis/gastritis as cause for symptoms.  Doubt aortic dissection, or impending vascular collapse.  No indication for further ED evaluation at this time.  CRITICAL CARE- no Performed by: Daleen Bo  Nursing Notes Reviewed/ Care Coordinated Applicable Imaging Reviewed Interpretation of Laboratory Data incorporated into ED treatment  The patient appears reasonably screened and/or stabilized for discharge and I doubt any other medical condition or other [facility] requiring further screening, evaluation, or treatment in the ED at this time prior to discharge.  Plan: Home Medications-continue usual, use antacid before meals and at bedtime, gastric acid blocker or PPI; Home Treatments-gradual advance diet and activity; return here if the recommended treatment, does  not improve the symptoms; Recommended follow up-PCP follow-up 1 to 2 weeks and as needed      Final Clinical Impression(s) / ED Diagnoses Final diagnoses:  Nonspecific chest pain    Rx / DC Orders ED Discharge Orders     None         Daleen Bo, MD 12/20/20 1205

## 2020-12-19 NOTE — ED Triage Notes (Signed)
Patient complains of regular intermittent chest pain that started approximately twelve years ago but has gotten more intense three days ago. Pain radiates around shoulder blade and from back through to chest. Patient denies shortness of breath, denies nausea and vomiting.  Patient alert, oriented, ambulatory, and in no apparent distress at this time.

## 2020-12-19 NOTE — ED Provider Notes (Signed)
Emergency Medicine Provider Triage Evaluation Note  Sue Lopez , a 50 y.o. female  was evaluated in triage.  Pt complains of chest pain.  Patient states she has a history of intermittent pain, but has been worse/different over the past couple days.  Pain is in the left side, and radiates to her left shoulder blade.  No associated shortness of breath.  Pain improves with inspiration.  No PE risk factors.  Review of Systems  Positive: cp Negative: sob  Physical Exam  BP 116/89 (BP Location: Right Arm)   Pulse 88   Temp 98.9 F (37.2 C) (Oral)   Resp 16   LMP 12/09/2020   SpO2 100%  Gen:   Awake, no distress   Resp:  Normal effort  MSK:   Moves extremities without difficulty  Other:  No leg pain or swelling  Medical Decision Making  Medically screening exam initiated at 3:09 PM.  Appropriate orders placed.  Sue Lopez was informed that the remainder of the evaluation will be completed by another provider, this initial triage assessment does not replace that evaluation, and the importance of remaining in the ED until their evaluation is complete.  Labs, ekg, cxr   Franchot Heidelberg, PA-C 12/19/20 1510    Godfrey Pick, MD 12/21/20 [phone]

## 2020-12-19 NOTE — ED Notes (Signed)
Provider at bedside

## 2020-12-19 NOTE — Discharge Instructions (Signed)
ED personal vehicle- declines EMS

## 2020-12-19 NOTE — ED Triage Notes (Signed)
Pt reports Lt chest pain ,Lt arm pain and Lt upper back pain.

## 2021-01-31 ENCOUNTER — Encounter: Payer: 59 | Admitting: Physician Assistant

## 2021-02-08 ENCOUNTER — Ambulatory Visit (INDEPENDENT_AMBULATORY_CARE_PROVIDER_SITE_OTHER): Payer: Commercial Managed Care - PPO | Admitting: Physician Assistant

## 2021-02-08 ENCOUNTER — Other Ambulatory Visit: Payer: Self-pay

## 2021-02-08 VITALS — BP 105/70 | HR 76 | Temp 98.3°F | Ht 64.0 in | Wt 143.4 lb

## 2021-02-08 DIAGNOSIS — F411 Generalized anxiety disorder: Secondary | ICD-10-CM

## 2021-02-08 DIAGNOSIS — R239 Unspecified skin changes: Secondary | ICD-10-CM

## 2021-02-08 DIAGNOSIS — Z7689 Persons encountering health services in other specified circumstances: Secondary | ICD-10-CM

## 2021-02-08 DIAGNOSIS — Z0110 Encounter for examination of ears and hearing without abnormal findings: Secondary | ICD-10-CM

## 2021-02-08 MED ORDER — CITALOPRAM HYDROBROMIDE 10 MG PO TABS
10.0000 mg | ORAL_TABLET | Freq: Every day | ORAL | 2 refills | Status: DC
Start: 2021-02-08 — End: 2021-02-27

## 2021-02-08 NOTE — Patient Instructions (Addendum)
Good to meet you today!  Start on Celexa 10 mg. Take daily at same time each day. F/up in 6 weeks. Call sooner if any concerns.  Referral to derm placed. Hearing test normal in office.

## 2021-02-08 NOTE — Progress Notes (Signed)
Subjective:    Patient ID: Sue Lopez, female    DOB: 09-14-1970, 50 y.o.   MRN: [id number]  Chief Complaint  Patient presents with   Establish Care    HPI Patient is in today for [facility] from Dr. Juleen China. She has stayed UTD with her female exams with Dr. Talbert Nan.  Concerns today: -She would like her hearing checked, feels like she has to keep turning TV up -Recent ED visit for chest pains, work-up negative, & she was told to f/up with PCP about trial PPI. She has not had this pain since. No pain or SOB with going up and down stairs in town home.  -New skin lesions on face and right lower leg -Under a lot of stress recently trying to move mother from [place] to [place]; very anxious, cries often and spontaneously, feeling more overwhelmed. No SI.   Past Medical History:  Diagnosis Date   ACL (anterior cruciate ligament) tear 10/18/1986   Chickenpox    Elevated bilirubin 07/10/2017   Family Hx of alcoholism, both parents 06/05/2018   Genital warts    Hyperbilirubinemia 06/05/2018   Irregular menses 06/05/2018   Vitamin D deficiency 07/10/2017    Past Surgical History:  Procedure Laterality Date   ANTERIOR CRUCIATE LIGAMENT REPAIR Right 1989   BREAST SURGERY  04/18/2017   VARICOSE VEIN SURGERY Right     Family History  Problem Relation Age of Onset   Heart Problems Mother    Alcohol abuse Mother    COPD Mother    Hypertension Mother    Alcohol abuse Father    Diabetes Father    Heart Problems Father    Stroke Father    Alcohol abuse Maternal Grandfather    Hypertension Maternal Grandfather    Lung cancer Maternal Grandfather    Heart disease Paternal Grandmother    Hyperlipidemia Paternal Grandmother    Heart disease Paternal Grandfather    Hyperlipidemia Paternal Grandfather     Social History   Tobacco Use   Smoking status: Never   Smokeless tobacco: Former    Types: Chew    Quit date: 06/04/1992  Vaping Use   Vaping Use: Never used  Substance Use Topics   Alcohol  use: Yes    Comment: occasion glass of wine   Drug use: No     No Known Allergies  Review of Systems REFER TO HPI FOR PERTINENT POSITIVES AND NEGATIVES      Objective:     BP 105/70   Pulse 76   Temp 98.3 F (36.8 C)   Ht 5\' 4"  (1.626 m)   Wt 143 lb 6.1 oz (65 kg)   SpO2 99%   BMI 24.61 kg/m   Wt Readings from Last 3 Encounters:  02/08/21 143 lb 6.1 oz (65 kg)  11/20/20 147 lb (66.7 kg)  11/08/20 146 lb (66.2 kg)    BP Readings from Last 3 Encounters:  02/08/21 105/70  12/19/20 111/64  11/20/20 116/64     Physical Exam Vitals and nursing note reviewed.  Constitutional:      Appearance: Normal appearance. She is normal weight. She is not toxic-appearing.  HENT:     Head: Normocephalic and atraumatic.     Right Ear: Tympanic membrane, ear canal and external ear normal.     Left Ear: Tympanic membrane, ear canal and external ear normal.     Nose: Nose normal.     Mouth/Throat:     Mouth: Mucous membranes are moist.  Eyes:  Extraocular Movements: Extraocular movements intact.     Conjunctiva/sclera: Conjunctivae normal.     Pupils: Pupils are equal, round, and reactive to light.  Cardiovascular:     Rate and Rhythm: Normal rate and regular rhythm.     Pulses: Normal pulses.     Heart sounds: Normal heart sounds.  Pulmonary:     Effort: Pulmonary effort is normal.     Breath sounds: Normal breath sounds.  Musculoskeletal:        General: Normal range of motion.     Cervical back: Normal range of motion and neck supple.  Skin:    General: Skin is warm and dry.  Neurological:     General: No focal deficit present.     Mental Status: She is alert and oriented to person, place, and time.  Psychiatric:        Mood and Affect: Mood is anxious.        Speech: Speech is rapid and pressured.        Behavior: Behavior normal.        Thought Content: Thought content normal.        Judgment: Judgment normal.     Comments: Tearful at times        Assessment & Plan:   Problem List Items Addressed This Visit   None Visit Diagnoses     Encounter to establish care    -  Primary   Generalized anxiety disorder       Relevant Medications   citalopram (CELEXA) 10 MG tablet   Encounter for hearing examination without abnormal findings       Skin change       Relevant Orders   Ambulatory referral to Dermatology        Meds ordered this encounter  Medications   citalopram (CELEXA) 10 MG tablet    Sig: Take 1 tablet (10 mg total) by mouth daily.    Dispense:  30 tablet    Refill:  2    1. Encounter to establish care 2. Generalized anxiety disorder GAD 7 : Generalized Anxiety Score 02/08/2021  Nervous, Anxious, on Edge 2  Control/stop worrying 3  Worry too much - different things 3  Trouble relaxing 1  Restless 1  Easily annoyed or irritable 2  Afraid - awful might happen 2  Total GAD 7 Score 14  Anxiety Difficulty Somewhat difficult   Moderate to high score.  Spent most of our discussion on this today.  Discussed several options with her today including natural remedies, counseling, SSRIs.  She agrees to try Celexa 10 mg.  Possible side effects discussed with her.  We will plan on close follow-up as she starts this medication and she knows to call sooner if she has any concerns.  3. Encounter for hearing examination without abnormal findings In office hearing test was normal.  Should she continue to have issues she may need a referral to ENT.  4. Skin change She would benefit from a full skin exam and referral to dermatology has been placed.  I do not see any highly suspicious lesions on quick overview today.   This note was prepared with assistance of Systems analyst. Occasional wrong-word or sound-a-like substitutions may have occurred due to the inherent limitations of voice recognition software.  Time Spent: 38 minutes of total time was spent on the date of the encounter performing the following  actions: chart review prior to seeing the patient, obtaining history, performing a medically necessary  exam, counseling on the treatment plan, placing orders, and documenting in our EHR.    Harsimran Westman M Kenji Mapel, PA-C

## 2021-02-27 ENCOUNTER — Other Ambulatory Visit: Payer: Self-pay

## 2021-02-27 ENCOUNTER — Telehealth: Payer: Self-pay

## 2021-02-27 MED ORDER — CITALOPRAM HYDROBROMIDE 10 MG PO TABS: 10.0000 mg | ORAL_TABLET | Freq: Every day | ORAL | 0 refills | Status: DC

## 2021-02-27 NOTE — Telephone Encounter (Signed)
Pt called stating that she is currently in [place] and accidentally dropped her medication down the sink. Dalilah wants to know if Alyssa can call Citalopram to [facility], [place] [address]. Please Advise.

## 2021-02-27 NOTE — Telephone Encounter (Signed)
Rx sent in

## 2021-03-07 ENCOUNTER — Telehealth: Payer: Self-pay | Admitting: Dermatology

## 2021-03-07 NOTE — Telephone Encounter (Signed)
Referral opened notes documented and routed back to referring office.

## 2021-03-07 NOTE — Telephone Encounter (Signed)
Patient is calling for a referral appointment from [facility], PA-C's office.  Patient does not want to wait until April 2023 for an appointment so would like referral sent back to [facility], PA-C's office.

## 2021-03-12 ENCOUNTER — Encounter: Payer: Self-pay | Admitting: Physician Assistant

## 2021-03-14 ENCOUNTER — Other Ambulatory Visit: Payer: Self-pay | Admitting: Physician Assistant

## 2021-03-14 DIAGNOSIS — F411 Generalized anxiety disorder: Secondary | ICD-10-CM

## 2021-03-22 ENCOUNTER — Encounter: Payer: Self-pay | Admitting: Physician Assistant

## 2021-03-22 ENCOUNTER — Other Ambulatory Visit: Payer: Self-pay

## 2021-03-22 ENCOUNTER — Ambulatory Visit (INDEPENDENT_AMBULATORY_CARE_PROVIDER_SITE_OTHER): Payer: Commercial Managed Care - PPO | Admitting: Physician Assistant

## 2021-03-22 VITALS — BP 94/62 | HR 69 | Temp 98.4°F | Ht 64.0 in | Wt 141.5 lb

## 2021-03-22 DIAGNOSIS — F411 Generalized anxiety disorder: Secondary | ICD-10-CM

## 2021-03-22 NOTE — Progress Notes (Signed)
Subjective:    Patient ID: Sue Lopez, female    DOB: March 22, 1971, 50 y.o.   MRN: [id number]  Chief Complaint  Patient presents with   Medication Management   Referral    HPI Patient is in today for anxiety recheck. She has been taking Celexa 10 mg daily and has only missed one dose since last visit. She feels like she is seeing some positive changes and has not had any side effects.  Recently went to [place] and had mother moved to [facility], which she says has taken some stress off of her. She and her partner are moving themselves as well, but she is happy about this.  Still struggling with some negativity and says she has "first world problems", but still wants to talk through this. She tried two sessions of counseling through her company to discuss some of these issues, but had a bad experience where the counselor fell asleep on the phone. She is willing to try someone else.   Past Medical History:  Diagnosis Date   ACL (anterior cruciate ligament) tear 10/18/1986   Chickenpox    Elevated bilirubin 07/10/2017   Family Hx of alcoholism, both parents 06/05/2018   Genital warts    Hyperbilirubinemia 06/05/2018   Irregular menses 06/05/2018   Vitamin D deficiency 07/10/2017    Past Surgical History:  Procedure Laterality Date   ANTERIOR CRUCIATE LIGAMENT REPAIR Right 1989   BREAST SURGERY  04/18/2017   VARICOSE VEIN SURGERY Right     Family History  Problem Relation Age of Onset   Heart Problems Mother    Alcohol abuse Mother    COPD Mother    Hypertension Mother    Alcohol abuse Father    Diabetes Father    Heart Problems Father    Stroke Father    Alcohol abuse Maternal Grandfather    Hypertension Maternal Grandfather    Lung cancer Maternal Grandfather    Heart disease Paternal Grandmother    Hyperlipidemia Paternal Grandmother    Heart disease Paternal Grandfather    Hyperlipidemia Paternal Grandfather     Social History   Tobacco Use   Smoking status: Never    Smokeless tobacco: Former    Types: Chew    Quit date: 06/04/1992  Vaping Use   Vaping Use: Never used  Substance Use Topics   Alcohol use: Yes    Comment: occasion glass of wine   Drug use: No     No Known Allergies  Review of Systems REFER TO HPI FOR PERTINENT POSITIVES AND NEGATIVES      Objective:     BP 94/62   Pulse 69   Temp 98.4 F (36.9 C)   Ht 5\' 4"  (1.626 m)   Wt 141 lb 8 oz (64.2 kg)   SpO2 100%   BMI 24.29 kg/m   Wt Readings from Last 3 Encounters:  03/22/21 141 lb 8 oz (64.2 kg)  02/08/21 143 lb 6.1 oz (65 kg)  11/20/20 147 lb (66.7 kg)    BP Readings from Last 3 Encounters:  03/22/21 94/62  02/08/21 105/70  12/19/20 111/64     Physical Exam Vitals and nursing note reviewed.  Constitutional:      Appearance: Normal appearance. She is normal weight. She is not toxic-appearing.  HENT:     Head: Normocephalic and atraumatic.     Right Ear: Tympanic membrane, ear canal and external ear normal.     Left Ear: Tympanic membrane, ear canal and external ear normal.  Nose: Nose normal.     Mouth/Throat:     Mouth: Mucous membranes are moist.  Eyes:     Extraocular Movements: Extraocular movements intact.     Conjunctiva/sclera: Conjunctivae normal.     Pupils: Pupils are equal, round, and reactive to light.  Cardiovascular:     Rate and Rhythm: Normal rate and regular rhythm.     Pulses: Normal pulses.     Heart sounds: Normal heart sounds.  Pulmonary:     Effort: Pulmonary effort is normal.     Breath sounds: Normal breath sounds.  Musculoskeletal:        General: Normal range of motion.     Cervical back: Normal range of motion and neck supple.  Skin:    General: Skin is warm and dry.  Neurological:     General: No focal deficit present.     Mental Status: She is alert and oriented to person, place, and time.  Psychiatric:        Mood and Affect: Mood is anxious (better today).        Speech: Speech is not rapid and pressured.         Behavior: Behavior normal.        Thought Content: Thought content normal.        Judgment: Judgment normal.     Comments: More relaxed today, not tearful.       Assessment & Plan:   Problem List Items Addressed This Visit   None Visit Diagnoses     Generalized anxiety disorder    -  Primary   Relevant Orders   Ambulatory referral to Psychology      1. Generalized anxiety disorder -Today's GAD-7 score is 3-4, compared to 14 at last visit; definitely improving -New referral for counseling -Continue Celexa 10 mg daily -She will continue to work on stress reduction -F/up with me in 2-3 months   This note was prepared with assistance of Systems analyst. Occasional wrong-word or sound-a-like substitutions may have occurred due to the inherent limitations of voice recognition software.  Time Spent: 35 minutes of total time was spent on the date of the encounter performing the following actions: chart review prior to seeing the patient, obtaining history, performing a medically necessary exam, counseling on the treatment plan, placing orders, and documenting in our EHR.    Daneesha Quinteros M Mallory Schaad, PA-C

## 2021-03-22 NOTE — Patient Instructions (Signed)
Good to see you again! I have sent a new referral to [facility]. Continue on the Celexa 10 mg daily. Keep working on positive thoughts, get outside daily, go for walks.  Call sooner if any concerns.

## 2021-05-06 ENCOUNTER — Encounter: Payer: Self-pay | Admitting: Obstetrics and Gynecology

## 2021-06-02 ENCOUNTER — Other Ambulatory Visit: Payer: Self-pay | Admitting: Physician Assistant

## 2021-06-15 ENCOUNTER — Other Ambulatory Visit: Payer: Self-pay | Admitting: Physician Assistant

## 2021-06-15 MED ORDER — CITALOPRAM HYDROBROMIDE 10 MG PO TABS: 10.0000 mg | ORAL_TABLET | Freq: Every day | ORAL | 1 refills | Status: DC

## 2021-06-24 ENCOUNTER — Other Ambulatory Visit: Payer: Self-pay

## 2021-06-24 ENCOUNTER — Ambulatory Visit (INDEPENDENT_AMBULATORY_CARE_PROVIDER_SITE_OTHER): Payer: Commercial Managed Care - PPO | Admitting: Physician Assistant

## 2021-06-24 ENCOUNTER — Encounter: Payer: Self-pay | Admitting: Physician Assistant

## 2021-06-24 VITALS — BP 105/68 | HR 64 | Temp 98.3°F | Ht 64.0 in | Wt 147.4 lb

## 2021-06-24 DIAGNOSIS — F411 Generalized anxiety disorder: Secondary | ICD-10-CM

## 2021-06-24 MED ORDER — CITALOPRAM HYDROBROMIDE 10 MG PO TABS: 10.0000 mg | ORAL_TABLET | Freq: Every day | ORAL | 1 refills | Status: DC

## 2021-06-24 NOTE — Progress Notes (Signed)
Subjective:    Patient ID: Sue Lopez, female    DOB: [birth year], 51 y.o.   MRN: [id number]  Chief Complaint  Patient presents with   Anxiety    HPI Patient is in today for f/up on GAD. She has been taking Celexa 10 mg since Sept 2022 and doing better with this medication. She has had one "incident" since our last visit in November where she felt overwhelmed and couldn't think to answer questions, but otherwise feels like it helps to keep her stress levels down. She is still seeking a Company secretary. No other concerns today.   Past Medical History:  Diagnosis Date   ACL (anterior cruciate ligament) tear 10/18/1986   Chickenpox    Elevated bilirubin 07/10/2017   Family Hx of alcoholism, both parents 06/05/2018   Genital warts    Hyperbilirubinemia 06/05/2018   Irregular menses 06/05/2018   Vitamin D deficiency 07/10/2017    Past Surgical History:  Procedure Laterality Date   ANTERIOR CRUCIATE LIGAMENT REPAIR Right 1989   BREAST SURGERY  04/18/2017   VARICOSE VEIN SURGERY Right     Family History  Problem Relation Age of Onset   Heart Problems Mother    Alcohol abuse Mother    COPD Mother    Hypertension Mother    Alcohol abuse Father    Diabetes Father    Heart Problems Father    Stroke Father    Alcohol abuse Maternal Grandfather    Hypertension Maternal Grandfather    Lung cancer Maternal Grandfather    Heart disease Paternal Grandmother    Hyperlipidemia Paternal Grandmother    Heart disease Paternal Grandfather    Hyperlipidemia Paternal Grandfather     Social History   Tobacco Use   Smoking status: Never   Smokeless tobacco: Former    Types: Chew    Quit date: 06/04/1992  Vaping Use   Vaping Use: Never used  Substance Use Topics   Alcohol use: Yes    Comment: occasion glass of wine   Drug use: No     No Known Allergies  Review of Systems NEGATIVE UNLESS OTHERWISE INDICATED IN HPI      Objective:     BP 105/68    Pulse 64    Temp 98.3 F (36.8  C)    Ht 5\' 4"  (1.626 m)    Wt 147 lb 6.4 oz (66.9 kg)    LMP 05/31/2021    BMI 25.30 kg/m   Wt Readings from Last 3 Encounters:  06/24/21 147 lb 6.4 oz (66.9 kg)  03/22/21 141 lb 8 oz (64.2 kg)  02/08/21 143 lb 6.1 oz (65 kg)    BP Readings from Last 3 Encounters:  06/24/21 105/68  03/22/21 94/62  02/08/21 105/70     Physical Exam Constitutional:      Appearance: Normal appearance. She is normal weight.  Cardiovascular:     Rate and Rhythm: Normal rate.     Pulses: Normal pulses.  Pulmonary:     Effort: Pulmonary effort is normal.     Breath sounds: Normal breath sounds.  Neurological:     General: No focal deficit present.     Mental Status: She is alert and oriented to person, place, and time.  Psychiatric:        Mood and Affect: Mood normal.        Behavior: Behavior normal.        Thought Content: Thought content normal.        Judgment:  Judgment normal.       Assessment & Plan:   Problem List Items Addressed This Visit   None Visit Diagnoses     Generalized anxiety disorder    -  Primary   Relevant Medications   citalopram (CELEXA) 10 MG tablet        Meds ordered this encounter  Medications   citalopram (CELEXA) 10 MG tablet    Sig: Take 1 tablet (10 mg total) by mouth daily.    Dispense:  90 tablet    Refill:  1    1. Generalized anxiety disorder -Doing well with Celexa 10 mg, will continue this medication -Card provided for [facility] to try to connect with them -She will cont working on healthy lifestyle habits -F/up in 6 months or prn   Nyema Hachey M Bandon Sherwin, PA-C

## 2021-06-24 NOTE — Patient Instructions (Signed)
Very good to see you today! Continue on Celexa 10 mg daily. Check out [facility] in [place] - card provided. Keep up good work with healthy lifestyle - get outside daily, exercise, make wise food choices, limit ETOH, etc.   Call if any concerns sooner.

## 2021-07-11 ENCOUNTER — Other Ambulatory Visit: Payer: Self-pay | Admitting: Physician Assistant

## 2021-09-09 ENCOUNTER — Encounter: Payer: Self-pay | Admitting: Physician Assistant

## 2021-10-30 NOTE — Progress Notes (Signed)
51 y.o. G0P0000 Married White or Caucasian Not Hispanic or Latino female here for annual exam.   Period Cycle (Days): 29 Period Duration (Days): 4 Period Pattern: Regular Menstrual Flow: Moderate Menstrual Control: Thin pad Menstrual Control Change Freq (Hours): 3-4 Dysmenorrhea: None  Some perianal itching or burning. Feels like she may be getting a hemorrhoid. No bump. She has normal BM's.  Slight vulvar itching.   Just occasional urgency to void, rare episodes of urge incontinence. Improved, if she leaks it's just a spot.   Patient's last menstrual period was 10/27/2021.          Sexually active: Yes.    The current method of family planning is none.   Same sex partner  Exercising: No.  The patient does not participate in regular exercise at present. Smoker:  no  Health Maintenance: Pap: 11/08/20 negative, +HPV; 04-27-19 normal HR HPV POSITIVE. Colpo unsatisfactoy, ECC  06-10-19 neg History of abnormal Pap:  yes CIN I in 12/18. 2019 ASCUS +HPV pap, colpo unsatisfactory, ECC negative MMG:  05/06/21 Bi-rads 1 neg  BMD:   n/a Colonoscopy: n/a TDaP:  2020 Gardasil: n/a   reports that she has never smoked. She quit smokeless tobacco use about 29 years ago.  Her smokeless tobacco use included chew. She reports current alcohol use. She reports that she does not use drugs. ~5 glasses of wine a week. She is an Chief Financial Officer.  Past Medical History:  Diagnosis Date   ACL (anterior cruciate ligament) tear 10/18/1986   Chickenpox    Elevated bilirubin 07/10/2017   Family Hx of alcoholism, both parents 06/05/2018   Genital warts    Hyperbilirubinemia 06/05/2018   Irregular menses 06/05/2018   Vitamin D deficiency 07/10/2017    Past Surgical History:  Procedure Laterality Date   ANTERIOR CRUCIATE LIGAMENT REPAIR Right 1989   BREAST SURGERY  04/18/2017   VARICOSE VEIN SURGERY Right     Current Outpatient Medications  Medication Sig Dispense Refill   acetaminophen (TYLENOL) 325 MG tablet Take  650 mg by mouth as needed.     citalopram (CELEXA) 10 MG tablet TAKE 1 TABLET(10 MG) BY MOUTH DAILY 30 tablet 1   No current facility-administered medications for this visit.    Family History  Problem Relation Age of Onset   Heart Problems Mother    Alcohol abuse Mother    COPD Mother    Hypertension Mother    Alcohol abuse Father    Diabetes Father    Heart Problems Father    Stroke Father    Alcohol abuse Maternal Grandfather    Hypertension Maternal Grandfather    Lung cancer Maternal Grandfather    Heart disease Paternal Grandmother    Hyperlipidemia Paternal Grandmother    Heart disease Paternal Grandfather    Hyperlipidemia Paternal Grandfather     Review of Systems  All other systems reviewed and are negative.   Exam:   BP 104/62   Pulse 66   Ht '5\' 4"'$  (1.626 m)   Wt 152 lb (68.9 kg)   LMP 10/27/2021   SpO2 100%   BMI 26.09 kg/m   Weight change: '@WEIGHTCHANGE'$ @ Height:   Height: '5\' 4"'$  (162.6 cm)  Ht Readings from Last 3 Encounters:  11/13/21 '5\' 4"'$  (1.626 m)  06/24/21 '5\' 4"'$  (1.626 m)  03/22/21 '5\' 4"'$  (1.626 m)    General appearance: alert, cooperative and appears stated age Head: Normocephalic, without obvious abnormality, atraumatic Neck: no adenopathy, supple, symmetrical, trachea midline and thyroid normal to inspection  and palpation Lungs: clear to auscultation bilaterally Cardiovascular: regular rate and rhythm Breasts: normal appearance, no masses or tenderness Abdomen: soft, non-tender; non distended,  no masses,  no organomegaly Extremities: extremities normal, atraumatic, no cyanosis or edema Skin: Skin color, texture, turgor normal. No rashes or lesions Lymph nodes: Cervical, supraclavicular, and axillary nodes normal. No abnormal inguinal nodes palpated Neurologic: Grossly normal   Pelvic: External genitalia:  no lesions              Urethra:  normal appearing urethra with no masses, tenderness or lesions              Bartholins and Skenes:  normal                 Vagina: normal appearing vagina with an increase in thick, clumpy vaginal discharge.               Cervix: no lesions               Bimanual Exam:  Uterus:  normal size, contour, position, consistency, mobility, non-tender and retroverted              Adnexa: no mass, fullness, tenderness               Rectovaginal: Confirms               Anus:  normal sphincter tone, no lesions  Lovena Le, CMA chaperoned for the exam.  1. Well woman exam Discussed breast self exam Discussed calcium and vit D intake Mammogram in 12/23  2. Screening for cervical cancer - Cytology - PAP  3. Vitamin D deficiency Not taking a supplement.  - VITAMIN D 25 Hydroxy (Vit-D Deficiency, Fractures)  4. Elevated LDL cholesterol level - Lipid panel  5. Elevated bilirubin - Comprehensive metabolic panel  6. Colon cancer screening - Ambulatory referral to Gastroenterology  7. Vaginal discharge - WET PREP FOR [place], YEAST, CLUE: negative

## 2021-11-13 ENCOUNTER — Other Ambulatory Visit (HOSPITAL_COMMUNITY)
Admission: RE | Admit: 2021-11-13 | Discharge: 2021-11-13 | Disposition: A | Discharge status: Home / Self Care | Payer: Commercial Managed Care - PPO | Source: Ambulatory Visit | Attending: Obstetrics and Gynecology | Admitting: Obstetrics and Gynecology

## 2021-11-13 ENCOUNTER — Ambulatory Visit (INDEPENDENT_AMBULATORY_CARE_PROVIDER_SITE_OTHER): Payer: Commercial Managed Care - PPO | Admitting: Obstetrics and Gynecology

## 2021-11-13 ENCOUNTER — Encounter: Payer: Self-pay | Admitting: Obstetrics and Gynecology

## 2021-11-13 VITALS — BP 104/62 | HR 66 | Ht 64.0 in | Wt 152.0 lb

## 2021-11-13 DIAGNOSIS — E7800 Pure hypercholesterolemia, unspecified: Secondary | ICD-10-CM

## 2021-11-13 DIAGNOSIS — Z124 Encounter for screening for malignant neoplasm of cervix: Secondary | ICD-10-CM | POA: Insufficient documentation

## 2021-11-13 DIAGNOSIS — R17 Unspecified jaundice: Secondary | ICD-10-CM | POA: Diagnosis not present

## 2021-11-13 DIAGNOSIS — E559 Vitamin D deficiency, unspecified: Secondary | ICD-10-CM | POA: Diagnosis not present

## 2021-11-13 DIAGNOSIS — Z01419 Encounter for gynecological examination (general) (routine) without abnormal findings: Secondary | ICD-10-CM

## 2021-11-13 DIAGNOSIS — Z1211 Encounter for screening for malignant neoplasm of colon: Secondary | ICD-10-CM

## 2021-11-13 DIAGNOSIS — N898 Other specified noninflammatory disorders of vagina: Secondary | ICD-10-CM

## 2021-11-13 LAB — WET PREP FOR TRICH, YEAST, CLUE

## 2021-11-13 LAB — LIPID PANEL
Cholesterol: 208 mg/dL — ABNORMAL HIGH (ref <200)
HDL: 73 mg/dL (ref >=50)
LDL Cholesterol (Calc): 120 mg/dL (calc) — ABNORMAL HIGH
Non-HDL Cholesterol (Calc): 135 mg/dL (calc) — ABNORMAL HIGH (ref <130)
Total CHOL/HDL Ratio: 2.8 (calc) (ref <5.0)
Triglycerides: 48 mg/dL (ref <150)

## 2021-11-13 LAB — COMPREHENSIVE METABOLIC PANEL
AG Ratio: 2.3 (calc) (ref 1.0–2.5)
ALT: 19 U/L (ref 6–29)
AST: 16 U/L (ref 10–35)
Albumin: 4.3 g/dL (ref 3.6–5.1)
Alkaline phosphatase (APISO): 32 U/L (ref 31–125)
BUN: 13 mg/dL (ref 7–25)
CO2: 27 mmol/L (ref 20–32)
Calcium: 9.2 mg/dL (ref 8.6–10.2)
Chloride: 108 mmol/L (ref 98–110)
Creat: 0.89 mg/dL (ref 0.50–0.99)
Globulin: 1.9 g/dL (calc) (ref 1.9–3.7)
Glucose, Bld: 97 mg/dL (ref 65–99)
Potassium: 5.2 mmol/L (ref 3.5–5.3)
Sodium: 141 mmol/L (ref 135–146)
Total Bilirubin: 1.5 mg/dL — ABNORMAL HIGH (ref 0.2–1.2)
Total Protein: 6.2 g/dL (ref 6.1–8.1)

## 2021-11-13 LAB — VITAMIN D 25 HYDROXY (VIT D DEFICIENCY, FRACTURES): Vit D, 25-Hydroxy: 29 ng/mL — ABNORMAL LOW (ref 30–100)

## 2021-11-13 NOTE — Patient Instructions (Signed)

## 2021-11-15 LAB — CYTOLOGY - PAP
Comment: NEGATIVE
Diagnosis: NEGATIVE
High risk HPV: POSITIVE — AB

## 2021-11-25 ENCOUNTER — Other Ambulatory Visit: Payer: Self-pay | Admitting: *Deleted

## 2021-11-25 DIAGNOSIS — R87810 Cervical high risk human papillomavirus (HPV) DNA test positive: Secondary | ICD-10-CM

## 2021-12-23 ENCOUNTER — Ambulatory Visit: Payer: Commercial Managed Care - PPO | Admitting: Physician Assistant

## 2021-12-30 ENCOUNTER — Ambulatory Visit (INDEPENDENT_AMBULATORY_CARE_PROVIDER_SITE_OTHER): Payer: Commercial Managed Care - PPO | Admitting: Physician Assistant

## 2021-12-30 ENCOUNTER — Encounter: Payer: Self-pay | Admitting: Physician Assistant

## 2021-12-30 VITALS — BP 102/70 | HR 76 | Temp 98.4°F | Ht 64.0 in | Wt 150.4 lb

## 2021-12-30 DIAGNOSIS — E559 Vitamin D deficiency, unspecified: Secondary | ICD-10-CM | POA: Diagnosis not present

## 2021-12-30 DIAGNOSIS — Z1211 Encounter for screening for malignant neoplasm of colon: Secondary | ICD-10-CM

## 2021-12-30 DIAGNOSIS — F411 Generalized anxiety disorder: Secondary | ICD-10-CM | POA: Diagnosis not present

## 2021-12-30 DIAGNOSIS — Z8742 Personal history of other diseases of the female genital tract: Secondary | ICD-10-CM | POA: Diagnosis not present

## 2021-12-30 MED ORDER — CITALOPRAM HYDROBROMIDE 10 MG PO TABS: ORAL_TABLET | ORAL | 3 refills | Status: DC

## 2021-12-30 NOTE — Progress Notes (Signed)
Subjective:    Patient ID: Sue Lopez, female    DOB: 1970-08-13, 51 y.o.   MRN: [id number]  Chief Complaint  Patient presents with   Follow-up    Pt in for f/u and medication refills; referral has been placed end June for Colonoscopy but not received a call to schedule appt.     HPI Patient is in today for regular recheck. Doing well overall. Just got back from work-trip to [place], states this has alleviated a lot of recent stress. Good relationships with mom and wife. Thinks Celexa 10 mg still working well. Hired a house cleaner, working on staying de-cluttered and organized. Has followed with GYN - HPV+, will have another colposcopy; Vit D Def, has been taking OTC supplement. No other concerns today.   Past Medical History:  Diagnosis Date   ACL (anterior cruciate ligament) tear 10/18/1986   Chickenpox    Elevated bilirubin 07/10/2017   Family Hx of alcoholism, both parents 06/05/2018   Genital warts    Hyperbilirubinemia 06/05/2018   Irregular menses 06/05/2018   Vitamin D deficiency 07/10/2017    Past Surgical History:  Procedure Laterality Date   ANTERIOR CRUCIATE LIGAMENT REPAIR Right 1989   BREAST SURGERY  04/18/2017   VARICOSE VEIN SURGERY Right     Family History  Problem Relation Age of Onset   Heart Problems Mother    Alcohol abuse Mother    COPD Mother    Hypertension Mother    Alcohol abuse Father    Diabetes Father    Heart Problems Father    Stroke Father    Alcohol abuse Maternal Grandfather    Hypertension Maternal Grandfather    Lung cancer Maternal Grandfather    Heart disease Paternal Grandmother    Hyperlipidemia Paternal Grandmother    Heart disease Paternal Grandfather    Hyperlipidemia Paternal Grandfather     Social History   Tobacco Use   Smoking status: Never   Smokeless tobacco: Former    Types: Chew    Quit date: 06/04/1992  Vaping Use   Vaping Use: Never used  Substance Use Topics   Alcohol use: Yes    Comment: occasion glass  of wine   Drug use: No     No Known Allergies  Review of Systems NEGATIVE UNLESS OTHERWISE INDICATED IN HPI      Objective:     BP 102/70 (BP Location: Left Arm)   Pulse 76   Temp 98.4 F (36.9 C) (Temporal)   Ht '5\' 4"'$  (1.626 m)   Wt 150 lb 6.4 oz (68.2 kg)   LMP 12/28/2021 (Exact Date)   SpO2 98%   BMI 25.82 kg/m   Wt Readings from Last 3 Encounters:  12/30/21 150 lb 6.4 oz (68.2 kg)  11/13/21 152 lb (68.9 kg)  06/24/21 147 lb 6.4 oz (66.9 kg)    BP Readings from Last 3 Encounters:  12/30/21 102/70  11/13/21 104/62  06/24/21 105/68     Physical Exam Vitals and nursing note reviewed.  Constitutional:      Appearance: Normal appearance. She is normal weight. She is not toxic-appearing.  HENT:     Head: Normocephalic and atraumatic.  Eyes:     Extraocular Movements: Extraocular movements intact.     Conjunctiva/sclera: Conjunctivae normal.     Pupils: Pupils are equal, round, and reactive to light.  Cardiovascular:     Rate and Rhythm: Normal rate and regular rhythm.     Pulses: Normal pulses.  Heart sounds: Normal heart sounds.  Pulmonary:     Effort: Pulmonary effort is normal.     Breath sounds: Normal breath sounds.  Musculoskeletal:        General: Normal range of motion.     Cervical back: Normal range of motion and neck supple.  Skin:    General: Skin is warm and dry.  Neurological:     General: No focal deficit present.     Mental Status: She is alert and oriented to person, place, and time.  Psychiatric:        Mood and Affect: Mood normal.        Behavior: Behavior normal.        Thought Content: Thought content normal.        Judgment: Judgment normal.        Assessment & Plan:   Problem List Items Addressed This Visit       Other   Vitamin D deficiency   Hx of abnormal cervical Pap smear, followed by GYN   Other Visit Diagnoses     Generalized anxiety disorder    -  Primary   Relevant Medications   citalopram (CELEXA) 10  MG tablet   Screening for colon cancer       Relevant Orders   Cologuard        Meds ordered this encounter  Medications   citalopram (CELEXA) 10 MG tablet    Sig: TAKE 1 TABLET(10 MG) BY MOUTH DAILY    Dispense:  90 tablet    Refill:  3    Order Specific Question:   Supervising Provider    Answer:   HUNTER, STEPHEN O [[id number]]   1. Generalized anxiety disorder Stable, doing well with Celexa 10 mg, refilled today  2. Screening for colon cancer GI has a long waiting list at this time - talked about waiting for colonoscopy or pursuing Cologuard - she is a candidate for this, ordered today.  3. Vitamin D deficiency Per recent labs - she is taking OTC supplement  4. Hx of abnormal cervical Pap smear, followed by GYN Colposcopy with Dr. Talbert Nan soon   Return in about 6 months (around 07/02/2022) for med recheck .    Sue Totten M Liz Pinho, PA-C

## 2021-12-31 ENCOUNTER — Telehealth: Payer: Self-pay

## 2021-12-31 NOTE — Telephone Encounter (Signed)
No, spotting is fine.

## 2021-12-31 NOTE — Telephone Encounter (Signed)
Spoke with patient and informed her. °

## 2021-12-31 NOTE — Telephone Encounter (Signed)
Scheduled for colpo tomorrow. Is on the very end of her period with light spotting and suspects she will still have light spotting tomorrow. Asked if she should r/s?

## 2022-01-01 ENCOUNTER — Encounter: Payer: Self-pay | Admitting: Obstetrics and Gynecology

## 2022-01-01 ENCOUNTER — Other Ambulatory Visit (HOSPITAL_COMMUNITY)
Admission: RE | Admit: 2022-01-01 | Discharge: 2022-01-01 | Disposition: A | Discharge status: Home / Self Care | Payer: Commercial Managed Care - PPO | Source: Ambulatory Visit | Attending: Obstetrics and Gynecology | Admitting: Obstetrics and Gynecology

## 2022-01-01 ENCOUNTER — Ambulatory Visit (INDEPENDENT_AMBULATORY_CARE_PROVIDER_SITE_OTHER): Payer: Commercial Managed Care - PPO | Admitting: Obstetrics and Gynecology

## 2022-01-01 VITALS — BP 110/68 | HR 66 | Wt 150.0 lb

## 2022-01-01 DIAGNOSIS — N882 Stricture and stenosis of cervix uteri: Secondary | ICD-10-CM

## 2022-01-01 DIAGNOSIS — R87810 Cervical high risk human papillomavirus (HPV) DNA test positive: Secondary | ICD-10-CM | POA: Diagnosis present

## 2022-01-01 NOTE — Patient Instructions (Signed)

## 2022-01-01 NOTE — Progress Notes (Signed)
GYNECOLOGY  VISIT   HPI: 51 y.o.   Married White or Caucasian Not Hispanic or Latino  female   G0P0000 with Patient's last menstrual period was 12/28/2021 (exact date).   here for colposcopy.   Recent pap was negative with +HPV  Pap: 11/08/20 negative, +HPV  04-27-19 normal HR HPV POSITIVE. Colpo unsatisfactoy, ECC  06-10-19 neg  Prior h/o CIN I in 12/18.   GYNECOLOGIC HISTORY: Patient's last menstrual period was 12/28/2021 (exact date). Contraception: none same sex partner  Menopausal hormone therapy: none         OB History     Gravida  0   Para  0   Term  0   Preterm  0   AB  0   Living  0      SAB  0   IAB  0   Ectopic  0   Multiple  0   Live Births  0              Patient Active Problem List   Diagnosis Date Noted   Anxiety 06/05/2018   Family Hx of alcoholism, both parents 06/05/2018   Irregular menses 06/05/2018   Abnormal sensation of leg 06/05/2018   Hyperbilirubinemia 06/05/2018   Hx of abnormal cervical Pap smear, followed by GYN 06/05/2018   Fibrocystic breast changes 06/05/2018   Fatigue 07/10/2017   Elevated bilirubin 07/10/2017   Vitamin D deficiency 07/10/2017    Past Medical History:  Diagnosis Date   ACL (anterior cruciate ligament) tear 10/18/1986   Chickenpox    Elevated bilirubin 07/10/2017   Family Hx of alcoholism, both parents 06/05/2018   Genital warts    Hyperbilirubinemia 06/05/2018   Irregular menses 06/05/2018   Vitamin D deficiency 07/10/2017    Past Surgical History:  Procedure Laterality Date   ANTERIOR CRUCIATE LIGAMENT REPAIR Right 1989   BREAST SURGERY  04/18/2017   VARICOSE VEIN SURGERY Right     Current Outpatient Medications  Medication Sig Dispense Refill   acetaminophen (TYLENOL) 325 MG tablet Take 650 mg by mouth as needed.     cholecalciferol (VITAMIN D3) 25 MCG (1000 UNIT) tablet Take 1,000 Units by mouth daily.     citalopram (CELEXA) 10 MG tablet TAKE 1 TABLET(10 MG) BY MOUTH DAILY 90 tablet 3    No current facility-administered medications for this visit.     ALLERGIES: Patient has no known allergies.  Family History  Problem Relation Age of Onset   Heart Problems Mother    Alcohol abuse Mother    COPD Mother    Hypertension Mother    Alcohol abuse Father    Diabetes Father    Heart Problems Father    Stroke Father    Alcohol abuse Maternal Grandfather    Hypertension Maternal Grandfather    Lung cancer Maternal Grandfather    Heart disease Paternal Grandmother    Hyperlipidemia Paternal Grandmother    Heart disease Paternal Grandfather    Hyperlipidemia Paternal Grandfather     Social History   Socioeconomic History   Marital status: Married    Spouse name: Anderson Malta   Number of children: Not on file   Years of education: Not on file   Highest education level: Not on file  Occupational History   Occupation: Engineering [employer]    Employer: [employer]   Tobacco Use   Smoking status: Never   Smokeless tobacco: Former    Types: Chew    Quit date: 06/04/1992  Vaping Use  Vaping Use: Never used  Substance and Sexual Activity   Alcohol use: Yes    Comment: occasion glass of wine   Drug use: No   Sexual activity: Yes    Partners: Female    Birth control/protection: None    Comment: female partner  Other Topics Concern   Not on file  Social History Narrative   Active, but no regular exercise. Eats well, mostly Paleo. Likes to drink 1-2 glasses of wine or beer several days per week, but is mindful to watch this because of strong family history of alcoholism. No children, though she and her wife tried a few years ago. She is happy with current life without children, but admits that her wife struggles with it. 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 Admits that she "hates doctors" due to some poor experiences in the past. It is important to her to feel "heard." Prefers not to take medications if possible, but open if needed. Okay for  all preventive maintenance. Mother and father divorced when she was young. Father raised her, non-optimal environment, emotionally absent. She has a difficult time connecting with others, especially physically, but feels that she is an extrovert in that she recharges via time with friends.    Social Determinants of Health   Financial Resource Strain: Low Risk  (06/05/2018)   Overall Financial Resource Strain (CARDIA)    Difficulty of Paying Living Expenses: Not hard at all  Food Insecurity: No Food Insecurity (06/05/2018)   Hunger Vital Sign    Worried About Running Out of Food in the Last Year: Never true    Ran Out of Food in the Last Year: Never true  Transportation Needs: No Transportation Needs (06/05/2018)   PRAPARE - Hydrologist (Medical): No    Lack of Transportation (Non-Medical): No  Physical Activity: Not on file  Stress: Not on file  Social Connections: Not on file  Intimate Partner Violence: Not on file    Review of Systems  All other systems reviewed and are negative.   PHYSICAL EXAMINATION:    BP 110/68   Pulse 66   Wt 150 lb (68 kg)   LMP 12/28/2021 (Exact Date)   BMI 25.75 kg/m     General appearance: alert, cooperative and appears stated age   Pelvic: External genitalia:  no lesions              Urethra:  normal appearing urethra with no masses, tenderness or lesions              Bartholins and Skenes: normal                 Vagina: normal appearing vagina with normal color and discharge, no lesions              Cervix: no lesions, cervix stenotic  Colposcopy: unsatisfactory, no aceto-white changes, decreased lugols uptake at the external os at 1 o'clock, biopsy taken. Tenaculum was placed for traction to get the cervical biopsy. The cervix was dilated with the mini-dilators to a #3 hagar in order to obtain the ECC.  ECC obtained. Silver nitrate and monsels were needed for hemostasis. Negative lugols examination of the upper  vagina.               Chaperone was present for exam.  1. 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 Cervical stenosis (uterine  cervix) Needed cervical dilation to collect ECC

## 2022-01-03 LAB — SURGICAL PATHOLOGY

## 2022-01-22 LAB — COLOGUARD: COLOGUARD: NEGATIVE

## 2022-02-10 ENCOUNTER — Encounter: Payer: Self-pay | Admitting: *Deleted

## 2022-03-20 ENCOUNTER — Ambulatory Visit: Payer: Commercial Managed Care - PPO | Admitting: Physician Assistant

## 2022-03-25 ENCOUNTER — Encounter: Payer: Self-pay | Admitting: Physician Assistant

## 2022-03-25 ENCOUNTER — Ambulatory Visit (INDEPENDENT_AMBULATORY_CARE_PROVIDER_SITE_OTHER): Payer: Commercial Managed Care - PPO | Admitting: Physician Assistant

## 2022-03-25 ENCOUNTER — Ambulatory Visit (INDEPENDENT_AMBULATORY_CARE_PROVIDER_SITE_OTHER)
Admission: RE | Admit: 2022-03-25 | Discharge: 2022-03-25 | Disposition: A | Discharge status: Home / Self Care | Payer: Commercial Managed Care - PPO | Source: Ambulatory Visit | Attending: Physician Assistant | Admitting: Physician Assistant

## 2022-03-25 VITALS — BP 102/64 | HR 83 | Temp 97.3°F | Ht 64.0 in | Wt 156.2 lb

## 2022-03-25 DIAGNOSIS — R42 Dizziness and giddiness: Secondary | ICD-10-CM | POA: Diagnosis not present

## 2022-03-25 DIAGNOSIS — M79671 Pain in right foot: Secondary | ICD-10-CM

## 2022-03-25 DIAGNOSIS — H6991 Unspecified Eustachian tube disorder, right ear: Secondary | ICD-10-CM

## 2022-03-25 DIAGNOSIS — R519 Headache, unspecified: Secondary | ICD-10-CM | POA: Diagnosis not present

## 2022-03-25 MED ORDER — FLUTICASONE PROPIONATE 50 MCG/ACT NA SUSP: 2.0000 | Freq: Every day | NASAL | 0 refills | Status: DC

## 2022-03-25 MED ORDER — PREDNISONE 20 MG PO TABS: 20.0000 mg | ORAL_TABLET | Freq: Two times a day (BID) | ORAL | 0 refills | Status: AC

## 2022-03-25 MED ORDER — MECLIZINE HCL 12.5 MG PO TABS: ORAL_TABLET | ORAL | 0 refills | Status: DC

## 2022-03-25 NOTE — Progress Notes (Signed)
Subjective:    Patient ID: Sue Lopez, female    DOB: January 12, 1971, 51 y.o.   MRN: [id number]  Chief Complaint  Patient presents with   Dizziness    Pt c/o of lightheadedness that comes and goes for the past month starting 10/11; working in attic got dizzy, has symptoms marked on calendar of feeling spacey, coming and going; just hard to explain, pt explains a feeling of feeling high but no substance to feel that way, not been drinking alcohol since symptoms started. Ran fever Nov 1st at 100.8 and still feeling dizzy and nauseous, headaches as well.     HPI Patient is in today for several concerns.  Dizzy / lightheaded symptoms. Comes and goes as a "high" like feeling, but does not take any substances.  02/26/22 - very first day of symptoms, DIZZY, said she sat down and felt like she was spinning / going to fall off the toilet. Happened twice more that day when changing positions, such as getting up on a ladder. Did not happen again until 03/19/22 - fever/ dizzy / hang-over feeling during the night, restlessness.      ---Denies any changes in September or October such as no travel, no new supplements, still taking Celexa daily  ----Denies any chest pain, palpations, shortness of breath, weight loss, night sweats, fevers ---Has noticed some difficulty hearing, some whirring noises intermittently ---More frequent R sided headaches, feels like someone reaching into her brain, has been having to take Tylenol   2.  Bruise top of right foot - no known injury. Going on the last month. Hurts with palpation. Stretching the area made the pain worse.   Past Medical History:  Diagnosis Date   ACL (anterior cruciate ligament) tear 10/18/1986   Chickenpox    Elevated bilirubin 07/10/2017   Family Hx of alcoholism, both parents 06/05/2018   Genital warts    Hyperbilirubinemia 06/05/2018   Irregular menses 06/05/2018   Vitamin D deficiency 07/10/2017    Past Surgical History:  Procedure Laterality Date    ANTERIOR CRUCIATE LIGAMENT REPAIR Right 1989   BREAST SURGERY  04/18/2017   VARICOSE VEIN SURGERY Right     Family History  Problem Relation Age of Onset   Heart Problems Mother    Alcohol abuse Mother    COPD Mother    Hypertension Mother    Alcohol abuse Father    Diabetes Father    Heart Problems Father    Stroke Father    Alcohol abuse Maternal Grandfather    Hypertension Maternal Grandfather    Lung cancer Maternal Grandfather    Heart disease Paternal Grandmother    Hyperlipidemia Paternal Grandmother    Heart disease Paternal Grandfather    Hyperlipidemia Paternal Grandfather     Social History   Tobacco Use   Smoking status: Never   Smokeless tobacco: Former    Types: Chew    Quit date: 06/04/1992  Vaping Use   Vaping Use: Never used  Substance Use Topics   Alcohol use: Yes    Comment: occasion glass of wine   Drug use: No     No Known Allergies  Review of Systems NEGATIVE UNLESS OTHERWISE INDICATED IN HPI      Objective:     BP 102/64 (BP Location: Left Arm)   Pulse 83   Temp (!) 97.3 F (36.3 C) (Temporal)   Ht '5\' 4"'$  (1.626 m)   Wt 156 lb 3.2 oz (70.9 kg)   LMP 03/12/2022 (Exact Date)  Comment: Heavy period and unusal  SpO2 99%   BMI 26.81 kg/m   Wt Readings from Last 3 Encounters:  03/25/22 156 lb 3.2 oz (70.9 kg)  01/01/22 150 lb (68 kg)  12/30/21 150 lb 6.4 oz (68.2 kg)    BP Readings from Last 3 Encounters:  03/25/22 102/64  01/01/22 110/68  12/30/21 102/70     Physical Exam Vitals and nursing note reviewed.  Constitutional:      General: She is not in acute distress.    Appearance: Normal appearance. She is not ill-appearing.  HENT:     Head: Normocephalic.     Right Ear: Ear canal and external ear normal. A middle ear effusion is present. Tympanic membrane is not erythematous or bulging.     Left Ear: Tympanic membrane, ear canal and external ear normal.     Nose: No congestion.     Mouth/Throat:     Mouth: Mucous  membranes are moist.     Pharynx: No oropharyngeal exudate or posterior oropharyngeal erythema.  Eyes:     Extraocular Movements: Extraocular movements intact.     Conjunctiva/sclera: Conjunctivae normal.     Pupils: Pupils are equal, round, and reactive to light.  Cardiovascular:     Rate and Rhythm: Normal rate and regular rhythm.     Pulses: Normal pulses.     Heart sounds: Normal heart sounds. No murmur heard. Pulmonary:     Effort: Pulmonary effort is normal. No respiratory distress.     Breath sounds: Normal breath sounds. No wheezing.  Abdominal:     General: Abdomen is flat. Bowel sounds are normal.     Palpations: Abdomen is soft. There is no mass.     Tenderness: There is no abdominal tenderness. There is no right CVA tenderness or left CVA tenderness.  Musculoskeletal:     Cervical back: Normal range of motion.  Skin:    General: Skin is warm.     Findings: Bruising ([place] Right foot area of bruising) present.     Comments: Right foot n/v intact, ROM normal  Neurological:     General: No focal deficit present.     Mental Status: She is alert and oriented to person, place, and time.     Cranial Nerves: No cranial nerve deficit.     Motor: No weakness.     Gait: Gait normal.  Psychiatric:        Mood and Affect: Mood normal.        Behavior: Behavior normal.        Assessment & Plan:  Dizziness and giddiness  Acute dysfunction of right eustachian tube -     Ambulatory referral to ENT  Frequent headaches  Right foot pain -     DG Foot Complete Right; Future  Other orders -     Meclizine HCl; Take 1 -2 tabs po up to three times daily as needed for dizziness.  Dispense: 30 tablet; Refill: 0 -     predniSONE; Take 1 tablet (20 mg total) by mouth 2 (two) times daily with a meal for 5 days.  Dispense: 10 tablet; Refill: 0 -     Fluticasone Propionate; Place 2 sprays into both nostrils daily.  Dispense: 16 g; Refill: 0   I think you are experiencing vertigo  symptoms secondary to something called eustachian tube dysfunction in the right ear.  In order to try to relieve some of that fluid, use the Flonase, prednisone, meclizine as directed.  You may  also want to take Sudafed for the next 3 days.  Keep well-hydrated.  I have sent a referral to ENT.  You will need to call [facility] to schedule an appointment.  Please go to [facility] for a foot x-ray to make sure your right foot is okay as well.  I am happy to see you back if anything worsens or changes.  You can also look up vertigo home physical therapy exercises online to help with symptoms.     Return if symptoms worsen or fail to improve.  This note was prepared with assistance of Systems analyst. Occasional wrong-word or sound-a-like substitutions may have occurred due to the inherent limitations of voice recognition software.  Time Spent: 39 minutes of total time was spent on the date of the encounter performing the following actions: chart review prior to seeing the patient, obtaining history, performing a medically necessary exam, counseling on the treatment plan, placing orders, and documenting in our EHR.       Broc Caspers M Brianne Maina, PA-C

## 2022-03-25 NOTE — Patient Instructions (Signed)
I think you are experiencing vertigo symptoms secondary to something called eustachian tube dysfunction in the right ear.  In order to try to relieve some of that fluid, use the Flonase, prednisone, meclizine as directed.  You may also want to take Sudafed for the next 3 days.  Keep well-hydrated.  I have sent a referral to ENT.  You will need to call [facility] ENT to schedule an appointment.  Please go to [facility] for a foot x-ray to make sure your right foot is okay as well.  I am happy to see you back if anything worsens or changes.  You can also look up vertigo home physical therapy exercises online to help with symptoms.

## 2022-03-27 ENCOUNTER — Ambulatory Visit: Payer: Commercial Managed Care - PPO | Admitting: Physician Assistant

## 2022-03-31 ENCOUNTER — Ambulatory Visit (INDEPENDENT_AMBULATORY_CARE_PROVIDER_SITE_OTHER): Payer: Commercial Managed Care - PPO | Admitting: Obstetrics and Gynecology

## 2022-03-31 ENCOUNTER — Encounter: Payer: Self-pay | Admitting: Obstetrics and Gynecology

## 2022-03-31 VITALS — BP 116/64 | HR 87 | Wt 159.0 lb

## 2022-03-31 DIAGNOSIS — N939 Abnormal uterine and vaginal bleeding, unspecified: Secondary | ICD-10-CM | POA: Diagnosis not present

## 2022-03-31 NOTE — Progress Notes (Signed)
GYNECOLOGY  VISIT   HPI: 51 y.o.   Married White or Caucasian Not Hispanic or Latino  female   [id number] with Patient's last menstrual period was 03/27/2022 (exact date).   here for irregular periods. 10/25 she had heavy bleeding she states that she then had spotting for several days until 03/21/22. Then she started again on 11/9 that was some what red but then she stopped yesterday.   Her baseline cycles are every 29 days x 4 days with moderate flow. No dysmenorrhea.  Her cycles were normal through her 02/24/22 cycle. She started spotting 03/12/22, then turned into a normal menstrual flow for her. She kept bleeding for 6 days. She bleed for a total of 9 days. Stopped around 03/20/22. Spotted on 03/26/22, started a cycle on 03/27/22, felt like a normal cycle. Stopped on 03/30/22.  During the abnormal bleeding in 10/23. She had some other issues going on. Felt dizzy, brain fog. Had a low grade fever 11/1 and 11/2. Negative covid test.   Last pap in 6/23, negative with +HPV. Colpo CIN I.  GYNECOLOGIC HISTORY: Patient's last menstrual period was 03/27/2022 (exact date). Contraception:none same sex partner  Menopausal hormone therapy: none         OB History     Gravida  0   Para  0   Term  0   Preterm  0   AB  0   Living  0      SAB  0   IAB  0   Ectopic  0   Multiple  0   Live Births  0              Patient Active Problem List   Diagnosis Date Noted   Anxiety 06/05/2018   Family Hx of alcoholism, both parents 06/05/2018   Irregular menses 06/05/2018   Abnormal sensation of leg 06/05/2018   Hyperbilirubinemia 06/05/2018   Hx of abnormal cervical Pap smear, followed by GYN 06/05/2018   Fibrocystic breast changes 06/05/2018   Fatigue 07/10/2017   Elevated bilirubin 07/10/2017   Vitamin D deficiency 07/10/2017    Past Medical History:  Diagnosis Date   ACL (anterior cruciate ligament) tear 10/18/1986   Chickenpox    Elevated bilirubin 07/10/2017   Family Hx of  alcoholism, both parents 06/05/2018   Genital warts    Hyperbilirubinemia 06/05/2018   Irregular menses 06/05/2018   Vitamin D deficiency 07/10/2017    Past Surgical History:  Procedure Laterality Date   ANTERIOR CRUCIATE LIGAMENT REPAIR Right 1989   BREAST SURGERY  04/18/2017   VARICOSE VEIN SURGERY Right     Current Outpatient Medications  Medication Sig Dispense Refill   acetaminophen (TYLENOL) 325 MG tablet Take 650 mg by mouth as needed.     cholecalciferol (VITAMIN D3) 25 MCG (1000 UNIT) tablet Take 1,000 Units by mouth daily.     citalopram (CELEXA) 10 MG tablet TAKE 1 TABLET(10 MG) BY MOUTH DAILY 90 tablet 3   fluticasone (FLONASE) 50 MCG/ACT nasal spray Place 2 sprays into both nostrils daily. 16 g 0   meclizine (ANTIVERT) 12.5 MG tablet Take 1 -2 tabs po up to three times daily as needed for dizziness. 30 tablet 0   No current facility-administered medications for this visit.     ALLERGIES: Patient has no known allergies.  Family History  Problem Relation Age of Onset   Heart Problems Mother    Alcohol abuse Mother    COPD Mother    Hypertension Mother  Alcohol abuse Father    Diabetes Father    Heart Problems Father    Stroke Father    Alcohol abuse Maternal Grandfather    Hypertension Maternal Grandfather    Lung cancer Maternal Grandfather    Heart disease Paternal Grandmother    Hyperlipidemia Paternal Grandmother    Heart disease Paternal Grandfather    Hyperlipidemia Paternal Grandfather     Social History   Socioeconomic History   Marital status: Married    Spouse name: Anderson Malta   Number of children: Not on file   Years of education: Not on file   Highest education level: Not on file  Occupational History   Occupation: Engineering [employer]    Employer: [employer]   Tobacco Use   Smoking status: Never   Smokeless tobacco: Former    Types: Chew    Quit date: 06/04/1992  Vaping Use   Vaping Use: Never used  Substance and Sexual Activity    Alcohol use: Yes    Comment: occasion glass of wine   Drug use: No   Sexual activity: Yes    Partners: Female    Birth control/protection: None    Comment: female partner  Other Topics Concern   Not on file  Social History Narrative   Active, but no regular exercise. Eats well, mostly Paleo. Likes to drink 1-2 glasses of wine or beer several days per week, but is mindful to watch this because of strong family history of alcoholism. No children, though she and her wife tried a few years ago. She is happy with current life without children, but admits that her wife struggles with it. 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 Admits that she "hates doctors" due to some poor experiences in the past. It is important to her to feel "heard." Prefers not to take medications if possible, but open if needed. Okay for all preventive maintenance. Mother and father divorced when she was young. Father raised her, non-optimal environment, emotionally absent. She has a difficult time connecting with others, especially physically, but feels that she is an extrovert in that she recharges via time with friends.    Social Determinants of Health   Financial Resource Strain: Low Risk  (06/05/2018)   Overall Financial Resource Strain (CARDIA)    Difficulty of Paying Living Expenses: Not hard at all  Food Insecurity: No Food Insecurity (06/05/2018)   Hunger Vital Sign    Worried About Running Out of Food in the Last Year: Never true    Ran Out of Food in the Last Year: Never true  Transportation Needs: No Transportation Needs (06/05/2018)   PRAPARE - Hydrologist (Medical): No    Lack of Transportation (Non-Medical): No  Physical Activity: Not on file  Stress: Not on file  Social Connections: Not on file  Intimate Partner Violence: Not on file    Review of Systems  All other systems reviewed and are negative.   PHYSICAL EXAMINATION:    BP 116/64    Pulse 87   Wt 159 lb (72.1 kg)   LMP 03/27/2022 (Exact Date) Comment: Heavy period and unusal  SpO2 100%   BMI 27.29 kg/m     General appearance: alert, cooperative and appears stated age Neck: no adenopathy, supple, symmetrical, trachea midline and thyroid normal to inspection and palpation   Pelvic: External genitalia:  no lesions              Urethra:  normal  appearing urethra with no masses, tenderness or lesions              Bartholins and Skenes: normal                 Vagina: normal appearing vagina with normal color and discharge, no lesions              Cervix: no cervical motion tenderness and no lesions              Bimanual Exam:  Uterus:  normal size, contour, position, consistency, mobility, non-tender and retroverted              Adnexa: no mass, fullness, tenderness               Chaperone was present for exam.  1. Abnormal uterine bleeding One abnormal episode of bleeding. Normal exam -She will calendar her bleeding, if this occurs again I will set her up for an ultrasound.  - TSH

## 2022-04-01 LAB — TSH: TSH: 3.81 mIU/L

## 2022-04-06 ENCOUNTER — Encounter: Payer: Self-pay | Admitting: Physician Assistant

## 2022-04-07 NOTE — Telephone Encounter (Signed)
Called ENT office and lvm for referral coordinator to return my call to schedule appt for patient

## 2022-04-08 ENCOUNTER — Ambulatory Visit (INDEPENDENT_AMBULATORY_CARE_PROVIDER_SITE_OTHER): Payer: Commercial Managed Care - PPO | Admitting: Internal Medicine

## 2022-04-08 ENCOUNTER — Ambulatory Visit: Payer: Commercial Managed Care - PPO | Admitting: Internal Medicine

## 2022-04-08 VITALS — BP 110/74 | HR 81 | Temp 98.2°F | Wt 158.4 lb

## 2022-04-08 DIAGNOSIS — R300 Dysuria: Secondary | ICD-10-CM

## 2022-04-08 DIAGNOSIS — R3989 Other symptoms and signs involving the genitourinary system: Secondary | ICD-10-CM | POA: Diagnosis not present

## 2022-04-08 LAB — POC URINALSYSI DIPSTICK (AUTOMATED)
Bilirubin, UA: NEGATIVE
Blood, UA: POSITIVE
Glucose, UA: NEGATIVE
Ketones, UA: NEGATIVE
Nitrite, UA: NEGATIVE
Protein, UA: NEGATIVE
Urobilinogen, UA: 0.2 E.U./dL
pH, UA: 6 (ref 5.0–8.0)

## 2022-04-08 MED ORDER — NITROFURANTOIN MONOHYD MACRO 100 MG PO CAPS: 100.0000 mg | ORAL_CAPSULE | Freq: Two times a day (BID) | ORAL | 0 refills | Status: AC

## 2022-04-08 MED ORDER — FLUCONAZOLE 150 MG PO TABS: 150.0000 mg | ORAL_TABLET | Freq: Once | ORAL | 0 refills | Status: AC

## 2022-04-08 NOTE — Patient Instructions (Signed)
Sounds like  standard  uti  Begin antibiotic  I also sent  .  Diflucan in case need treatment for yeast.  Can also get  otc miconazole  3 days in case .

## 2022-04-08 NOTE — Progress Notes (Signed)
Chief Complaint  Patient presents with   Dysuria    Pt c/o feeling urgency to urinate. Feeling discomfort to urinate. Sx started yesterday. Noticed blood dot when wiped also noticed urine are cloudy.     HPI: Sue Lopez 51 y.o. come in for new problem as above sda  no recnet uti  Aciute onset dysuria and suprapubic discomfort and urgency for 1-2 days  no fever  chills  acute gi sx . To travel to [place] in 6 days .  ROS: See pertinent positives and negatives per HPI. Just had gyne check about periods and to follow Past Medical History:  Diagnosis Date   ACL (anterior cruciate ligament) tear 10/18/1986   Chickenpox    Elevated bilirubin 07/10/2017   Family Hx of alcoholism, both parents 06/05/2018   Genital warts    Hyperbilirubinemia 06/05/2018   Irregular menses 06/05/2018   Vitamin D deficiency 07/10/2017    Family History  Problem Relation Age of Onset   Heart Problems Mother    Alcohol abuse Mother    COPD Mother    Hypertension Mother    Alcohol abuse Father    Diabetes Father    Heart Problems Father    Stroke Father    Alcohol abuse Maternal Grandfather    Hypertension Maternal Grandfather    Lung cancer Maternal Grandfather    Heart disease Paternal Grandmother    Hyperlipidemia Paternal Grandmother    Heart disease Paternal Grandfather    Hyperlipidemia Paternal Grandfather     Social History   Socioeconomic History   Marital status: Married    Spouse name: Anderson Malta   Number of children: Not on file   Years of education: Not on file   Highest education level: Bachelor's degree (e.g., BA, AB, BS)  Occupational History   Occupation: Oceanographer    Employer: [employer]   Tobacco Use   Smoking status: Never   Smokeless tobacco: Former    Types: Chew    Quit date: 06/04/1992  Vaping Use   Vaping Use: Never used  Substance and Sexual Activity   Alcohol use: Yes    Comment: occasion glass of wine   Drug use: No   Sexual activity: Yes    Partners:  Female    Birth control/protection: None    Comment: female partner  Other Topics Concern   Not on file  Social History Narrative   Active, but no regular exercise. Eats well, mostly Paleo. Likes to drink 1-2 glasses of wine or beer several days per week, but is mindful to watch this because of strong family history of alcoholism. No children, though she and her wife tried a few years ago. She is happy with current life without children, but admits that her wife struggles with it. 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 Admits that she "hates doctors" due to some poor experiences in the past. It is important to her to feel "heard." Prefers not to take medications if possible, but open if needed. Okay for all preventive maintenance. Mother and father divorced when she was young. Father raised her, non-optimal environment, emotionally absent. She has a difficult time connecting with others, especially physically, but feels that she is an extrovert in that she recharges via time with friends.    Social Determinants of Health   Financial Resource Strain: Low Risk  (04/08/2022)   Overall Financial Resource Strain (CARDIA)    Difficulty of Paying Living Expenses: Not hard at all  Food Insecurity: No Food Insecurity (04/08/2022)   Hunger Vital Sign    Worried About Running Out of Food in the Last Year: Never true    Ran Out of Food in the Last Year: Never true  Transportation Needs: No Transportation Needs (04/08/2022)   PRAPARE - Hydrologist (Medical): No    Lack of Transportation (Non-Medical): No  Physical Activity: Insufficiently Active (04/08/2022)   Exercise Vital Sign    Days of Exercise per Week: 3 days    Minutes of Exercise per Session: 30 min  Stress: No Stress Concern Present (04/08/2022)   [place]    Feeling of Stress : Only a little  Social Connections:  Socially Isolated (04/08/2022)   Social Connection and Isolation Panel [NHANES]    Frequency of Communication with Friends and Family: Once a week    Frequency of Social Gatherings with Friends and Family: Once a week    Attends Religious Services: Never    Marine scientist or Organizations: No    Attends Music therapist: Not on file    Marital Status: Married    Outpatient Medications Prior to Visit  Medication Sig Dispense Refill   acetaminophen (TYLENOL) 325 MG tablet Take 650 mg by mouth as needed.     cholecalciferol (VITAMIN D3) 25 MCG (1000 UNIT) tablet Take 1,000 Units by mouth daily.     citalopram (CELEXA) 10 MG tablet TAKE 1 TABLET(10 MG) BY MOUTH DAILY 90 tablet 3   fluticasone (FLONASE) 50 MCG/ACT nasal spray Place 2 sprays into both nostrils daily. 16 g 0   meclizine (ANTIVERT) 12.5 MG tablet Take 1 -2 tabs po up to three times daily as needed for dizziness. (Patient not taking: Reported on 04/08/2022) 30 tablet 0   No facility-administered medications prior to visit.     EXAM:  BP 110/74 (BP Location: Left Arm, Patient Position: Sitting, Cuff Size: Normal)   Pulse 81   Temp 98.2 F (36.8 C) (Oral)   Wt 158 lb 6.4 oz (71.8 kg)   LMP 03/27/2022 (Exact Date) Comment: Heavy period and unusal  SpO2 99%   BMI 27.19 kg/m   Body mass index is 27.19 kg/m.  GENERAL: vitals reviewed and listed above, alert, oriented, appears well hydrated and in no acute distress HEENT: atraumatic, conjunctiva  clear, no obvious abnormalities on inspection of external nose and ears  NECK: no obvious masses on inspection palpation  Abdomen:  Sof,t normal bowel sounds without hepatosplenomegaly, no guarding rebound or masses no CVA tenderness u ncomfrtab;e  over bladder  MS: moves all extremities without noticeable focal  abnormality PSYCH: pleasant and cooperative, no obvious depression or anxiety  BP Readings from Last 3 Encounters:  04/08/22 110/74  03/31/22  116/64  03/25/22 102/64  Urinalysis    Component Value Date/Time   COLORURINE YELLOW 04/01/2017 [place] 04/01/2017 0931   LABSPEC 1.008 04/01/2017 0931   PHURINE 5.5 04/01/2017 [place] 04/01/2017 [place] 04/01/2017 0931   BILIRUBINUR Negative 04/08/2022 1117   KETONESUR NEGATIVE 04/01/2017 0931   PROTEINUR Negative 04/08/2022 1117   PROTEINUR NEGATIVE 04/01/2017 0931   UROBILINOGEN 0.2 04/08/2022 1117   NITRITE Negative 04/08/2022 1117   NITRITE NEGATIVE 04/01/2017 0931   LEUKOCYTESUR Small (1+) (A) 04/08/2022 1117      ASSESSMENT AND PLAN:  Discussed the following assessment and plan:  Dysuria - Plan: POCT  Urinalysis Dipstick (Automated), Culture, Urine, Culture, Urine  Suspected UTI Cw acute cystitis   U cx pending   Expectant management.  Macrobid    Diflucan in case and can also use otc miconazole if needed.  Will be on look out for u cx results during holiday break  and call on call service if not getting better  -Patient advised to return or notify health care team  if  new concerns arise.  Patient Instructions  Sounds like  standard  uti  Begin antibiotic  I also sent  .  Diflucan in case need treatment for yeast.  Can also get  otc miconazole  3 days in case .      K. Meridee Branum M.D.

## 2022-04-11 ENCOUNTER — Encounter: Payer: Self-pay | Admitting: Internal Medicine

## 2022-04-11 LAB — URINE CULTURE
MICRO NUMBER:: 14218599
SPECIMEN QUALITY:: ADEQUATE

## 2022-04-12 NOTE — Progress Notes (Signed)
Medication given should treate the uti ( macrobid)

## 2022-05-20 ENCOUNTER — Encounter: Payer: Self-pay | Admitting: Obstetrics and Gynecology

## 2022-07-02 ENCOUNTER — Ambulatory Visit (INDEPENDENT_AMBULATORY_CARE_PROVIDER_SITE_OTHER): Payer: Commercial Managed Care - PPO | Admitting: Physician Assistant

## 2022-07-02 ENCOUNTER — Encounter: Payer: Self-pay | Admitting: Physician Assistant

## 2022-07-02 VITALS — BP 112/74 | HR 88 | Temp 97.7°F | Ht 64.0 in | Wt 164.2 lb

## 2022-07-02 DIAGNOSIS — M79671 Pain in right foot: Secondary | ICD-10-CM

## 2022-07-02 DIAGNOSIS — G8929 Other chronic pain: Secondary | ICD-10-CM | POA: Diagnosis not present

## 2022-07-02 DIAGNOSIS — F411 Generalized anxiety disorder: Secondary | ICD-10-CM | POA: Diagnosis not present

## 2022-07-02 MED ORDER — CITALOPRAM HYDROBROMIDE 10 MG PO TABS: ORAL_TABLET | ORAL | 3 refills | Status: DC

## 2022-07-02 NOTE — Assessment & Plan Note (Signed)
Stable Celexa 10 mg, refilled Cont natural mood lifters Enjoying video games lately and having more fun

## 2022-07-02 NOTE — Progress Notes (Signed)
Subjective:    Patient ID: Sue Lopez, female    DOB: 1971/02/08, 52 y.o.   MRN: [id number]  Chief Complaint  Patient presents with   Follow-up    Pt in for follow up for anxiety; pt also states right foot is still bothersome to her; not sure if she is needing as referral to podiatrist or sports med to evaluate since Xray didn't show fracture;     HPI Patient is in today for 6 month follow-up. Still doing well with Celexa. No major medical changes since last visit.   Also complains of R foot pain - going on several months, seems worse; XRAY in Nov was negative.   Past Medical History:  Diagnosis Date   ACL (anterior cruciate ligament) tear 10/18/1986   Chickenpox    Elevated bilirubin 07/10/2017   Family Hx of alcoholism, both parents 06/05/2018   Genital warts    Hyperbilirubinemia 06/05/2018   Irregular menses 06/05/2018   Vitamin D deficiency 07/10/2017    Past Surgical History:  Procedure Laterality Date   ANTERIOR CRUCIATE LIGAMENT REPAIR Right 1989   BREAST SURGERY  04/18/2017   VARICOSE VEIN SURGERY Right     Family History  Problem Relation Age of Onset   Heart Problems Mother    Alcohol abuse Mother    COPD Mother    Hypertension Mother    Alcohol abuse Father    Diabetes Father    Heart Problems Father    Stroke Father    Alcohol abuse Maternal Grandfather    Hypertension Maternal Grandfather    Lung cancer Maternal Grandfather    Heart disease Paternal Grandmother    Hyperlipidemia Paternal Grandmother    Heart disease Paternal Grandfather    Hyperlipidemia Paternal Grandfather     Social History   Tobacco Use   Smoking status: Never   Smokeless tobacco: Former    Types: Chew    Quit date: 06/04/1992  Vaping Use   Vaping Use: Never used  Substance Use Topics   Alcohol use: Yes    Comment: occasion glass of wine   Drug use: No     No Known Allergies  Review of Systems NEGATIVE UNLESS OTHERWISE INDICATED IN HPI      Objective:     BP  112/74 (BP Location: Left Arm)   Pulse 88   Temp 97.7 F (36.5 C) (Temporal)   Ht 5' 4"$  (1.626 m)   Wt 164 lb 3.2 oz (74.5 kg)   SpO2 98%   BMI 28.18 kg/m   Wt Readings from Last 3 Encounters:  07/02/22 164 lb 3.2 oz (74.5 kg)  04/08/22 158 lb 6.4 oz (71.8 kg)  03/31/22 159 lb (72.1 kg)    BP Readings from Last 3 Encounters:  07/02/22 112/74  04/08/22 110/74  03/31/22 116/64     Physical Exam Vitals and nursing note reviewed.  Constitutional:      Appearance: Normal appearance.  Cardiovascular:     Rate and Rhythm: Normal rate and regular rhythm.     Pulses: Normal pulses.  Pulmonary:     Effort: Pulmonary effort is normal.     Breath sounds: Normal breath sounds.  Musculoskeletal:     Right foot: Tenderness (TTP right fifth toe bunion; n/v intact) present.  Neurological:     Mental Status: She is alert.  Psychiatric:        Mood and Affect: Mood normal.        Behavior: Behavior normal.  Thought Content: Thought content normal.        Judgment: Judgment normal.        Assessment & Plan:  Chronic foot pain, right Assessment & Plan: Referral to podiatry; I think bunion 5th toe is causing issues   Orders: -     Ambulatory referral to Podiatry  Generalized anxiety disorder Assessment & Plan: Stable Celexa 10 mg, refilled Cont natural mood lifters Enjoying video games lately and having more fun    Other orders -     Citalopram Hydrobromide; TAKE 1 TABLET(10 MG) BY MOUTH DAILY  Dispense: 90 tablet; Refill: 3        Return in about 6 months (around 12/31/2022) for CPE, fasting labs .  This note was prepared with assistance of Systems analyst. Occasional wrong-word or sound-a-like substitutions may have occurred due to the inherent limitations of voice recognition software.     Caresse Sedivy M Tyray Proch, PA-C

## 2022-07-02 NOTE — Assessment & Plan Note (Signed)
Referral to podiatry; I think bunion 5th toe is causing issues

## 2022-07-03 DIAGNOSIS — Z01110 Encounter for hearing examination following failed hearing screening: Secondary | ICD-10-CM | POA: Insufficient documentation

## 2022-07-03 DIAGNOSIS — R42 Dizziness and giddiness: Secondary | ICD-10-CM | POA: Insufficient documentation

## 2022-07-03 HISTORY — DX: Encounter for hearing examination following failed hearing screening: Z01.110

## 2022-07-14 ENCOUNTER — Ambulatory Visit (INDEPENDENT_AMBULATORY_CARE_PROVIDER_SITE_OTHER): Payer: Commercial Managed Care - PPO | Admitting: Podiatry

## 2022-07-14 ENCOUNTER — Encounter: Payer: Self-pay | Admitting: Podiatry

## 2022-07-14 ENCOUNTER — Ambulatory Visit (INDEPENDENT_AMBULATORY_CARE_PROVIDER_SITE_OTHER): Payer: Commercial Managed Care - PPO

## 2022-07-14 DIAGNOSIS — G5761 Lesion of plantar nerve, right lower limb: Secondary | ICD-10-CM | POA: Diagnosis not present

## 2022-07-14 MED ORDER — MELOXICAM 15 MG PO TABS: 15.0000 mg | ORAL_TABLET | Freq: Every day | ORAL | 0 refills | Status: DC

## 2022-07-14 NOTE — Progress Notes (Signed)
  Subjective:  Patient ID: Sue Lopez, female    DOB: 08-Dec-1970,   MRN: [id number]  Chief Complaint  Patient presents with   Bunions     Possible  tailor's bunion on right foot near pinky toe     52 y.o. female presents for concern of right foot pain that has been going on since the summer. Relates an aching pain when walking relates she has tried good shoes including new balance but this pain has not improved.  . Denies any other pedal complaints. Denies n/v/f/c.   Past Medical History:  Diagnosis Date   ACL (anterior cruciate ligament) tear 10/18/1986   Chickenpox    Elevated bilirubin 07/10/2017   Family Hx of alcoholism, both parents 06/05/2018   Genital warts    Hyperbilirubinemia 06/05/2018   Irregular menses 06/05/2018   Vitamin D deficiency 07/10/2017    Objective:  Physical Exam: Vascular: DP/PT pulses 2/4 bilateral. CFT <3 seconds. Normal hair growth on digits. No edema.  Skin. No lacerations or abrasions bilateral feet.  Musculoskeletal: MMT 5/5 bilateral lower extremities in DF, PF, Inversion and Eversion. Deceased ROM in DF of ankle joint. Tender to third interspace upon palpation some pain over fourth and fifth MPJ. Pain with metatarsal squeeze. Positive mulders click.  Neurological: Sensation intact to light touch.   Assessment:   1. Right foot pain      Plan:  Patient was evaluated and treated and all questions answered. Discussed neuroma and treatment options with patient.  Radiographs reviewed and discussed with patient. No acute fractures or dislocations.  Reviewed notes from PCP.  Injection deferred today Discussed padding and offloading today.  Prescription for meloxicam provided.  Discussed if pain does not improve may consider  MRI for further surgical planning vs injection.  Patient to return in 6 weeks or sooner if concerns arise.     Lorenda Peck, DPM

## 2022-08-25 ENCOUNTER — Ambulatory Visit: Payer: Commercial Managed Care - PPO | Admitting: Podiatry

## 2022-09-02 ENCOUNTER — Ambulatory Visit (INDEPENDENT_AMBULATORY_CARE_PROVIDER_SITE_OTHER): Payer: Commercial Managed Care - PPO | Admitting: Podiatry

## 2022-09-02 ENCOUNTER — Encounter: Payer: Self-pay | Admitting: Podiatry

## 2022-09-02 ENCOUNTER — Ambulatory Visit (INDEPENDENT_AMBULATORY_CARE_PROVIDER_SITE_OTHER): Payer: Commercial Managed Care - PPO

## 2022-09-02 DIAGNOSIS — M84374A Stress fracture, right foot, initial encounter for fracture: Secondary | ICD-10-CM

## 2022-09-02 DIAGNOSIS — G5761 Lesion of plantar nerve, right lower limb: Secondary | ICD-10-CM | POA: Diagnosis not present

## 2022-09-02 NOTE — Progress Notes (Signed)
  Subjective:  Patient ID: Sue Lopez, female    DOB: 03/16/1971,   MRN: [id number]  Chief Complaint  Patient presents with   Neuroma    Patient states foot pain is about the same a previous visit     52 y.o. female presents for follow-up of right foot pain  that has been going on since the summer. Relates the padding and the meloxicam have not really helped. Relates more pain on the top of the foot today.  . Denies any other pedal complaints. Denies n/v/f/c.   Past Medical History:  Diagnosis Date   ACL (anterior cruciate ligament) tear 10/18/1986   Chickenpox    Elevated bilirubin 07/10/2017   Family Hx of alcoholism, both parents 06/05/2018   Genital warts    Hyperbilirubinemia 06/05/2018   Irregular menses 06/05/2018   Vitamin D deficiency 07/10/2017    Objective:  Physical Exam: Vascular: DP/PT pulses 2/4 bilateral. CFT <3 seconds. Normal hair growth on digits. No edema.  Skin. No lacerations or abrasions bilateral feet.  Musculoskeletal: MMT 5/5 bilateral lower extremities in DF, PF, Inversion and Eversion. Deceased ROM in DF of ankle joint. Tender to third interspace upon palpation some pain over fourth and fifth MPJ. Pain with metatarsal squeeze. Positive mulders click. More pain noted over the third and fourth metatarsal today with ecchymosis noted over the dorsum of the foot.  Neurological: Sensation intact to light touch.   Assessment:   1. Stress reaction of right foot, initial encounter   2. Morton neuroma, right       Plan:  Patient was evaluated and treated and all questions answered. Discussed neuroma and treatment options with patient.  Radiographs reviewed and discussed with patient. No acute fractures or dislocations. Some periosteal reaction noted around third and fourth metatarsals.  -Discussed treatement options for stress fracture; risks, alternatives, and benefits explained. -Dispensed CAM boot . Patient to wear at all times and instructed on  use -Recommend protection, rest, ice, elevation daily until symptoms improve -Rx pain med/antinflammatories as needed -Patient to return to office in 4 weeks for serial x-rays to assess healing  or sooner if condition worsens.      Louann Sjogren, DPM

## 2022-09-05 ENCOUNTER — Other Ambulatory Visit: Payer: Self-pay | Admitting: Podiatry

## 2022-09-05 IMAGING — CR DG CHEST 2V
2 series · 2 of 2 positions shown · non-contrast
Comparison: None

CLINICAL DATA: Shortness of breath, chest pain and shoulder pain in
a nonsmoker.

EXAM:
CHEST - 2 VIEW

[chest pa]
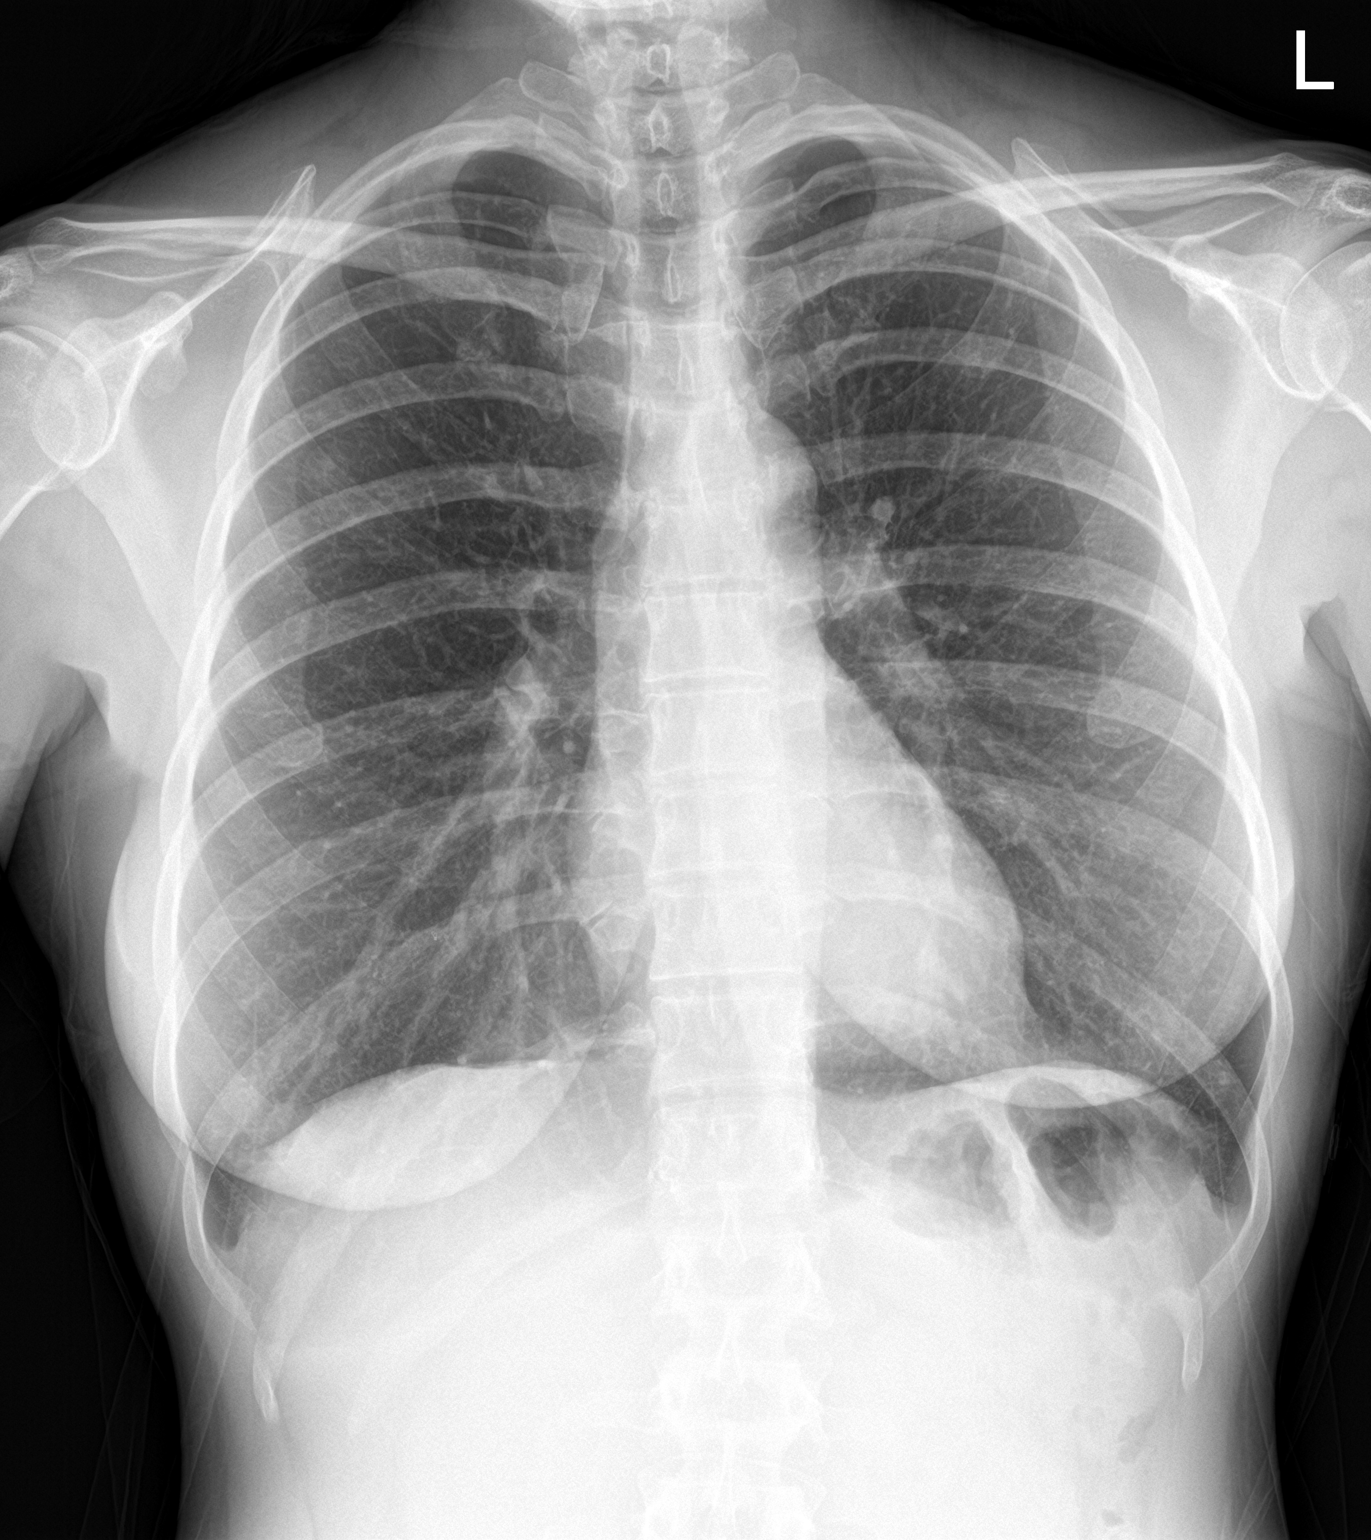

[chest lat]
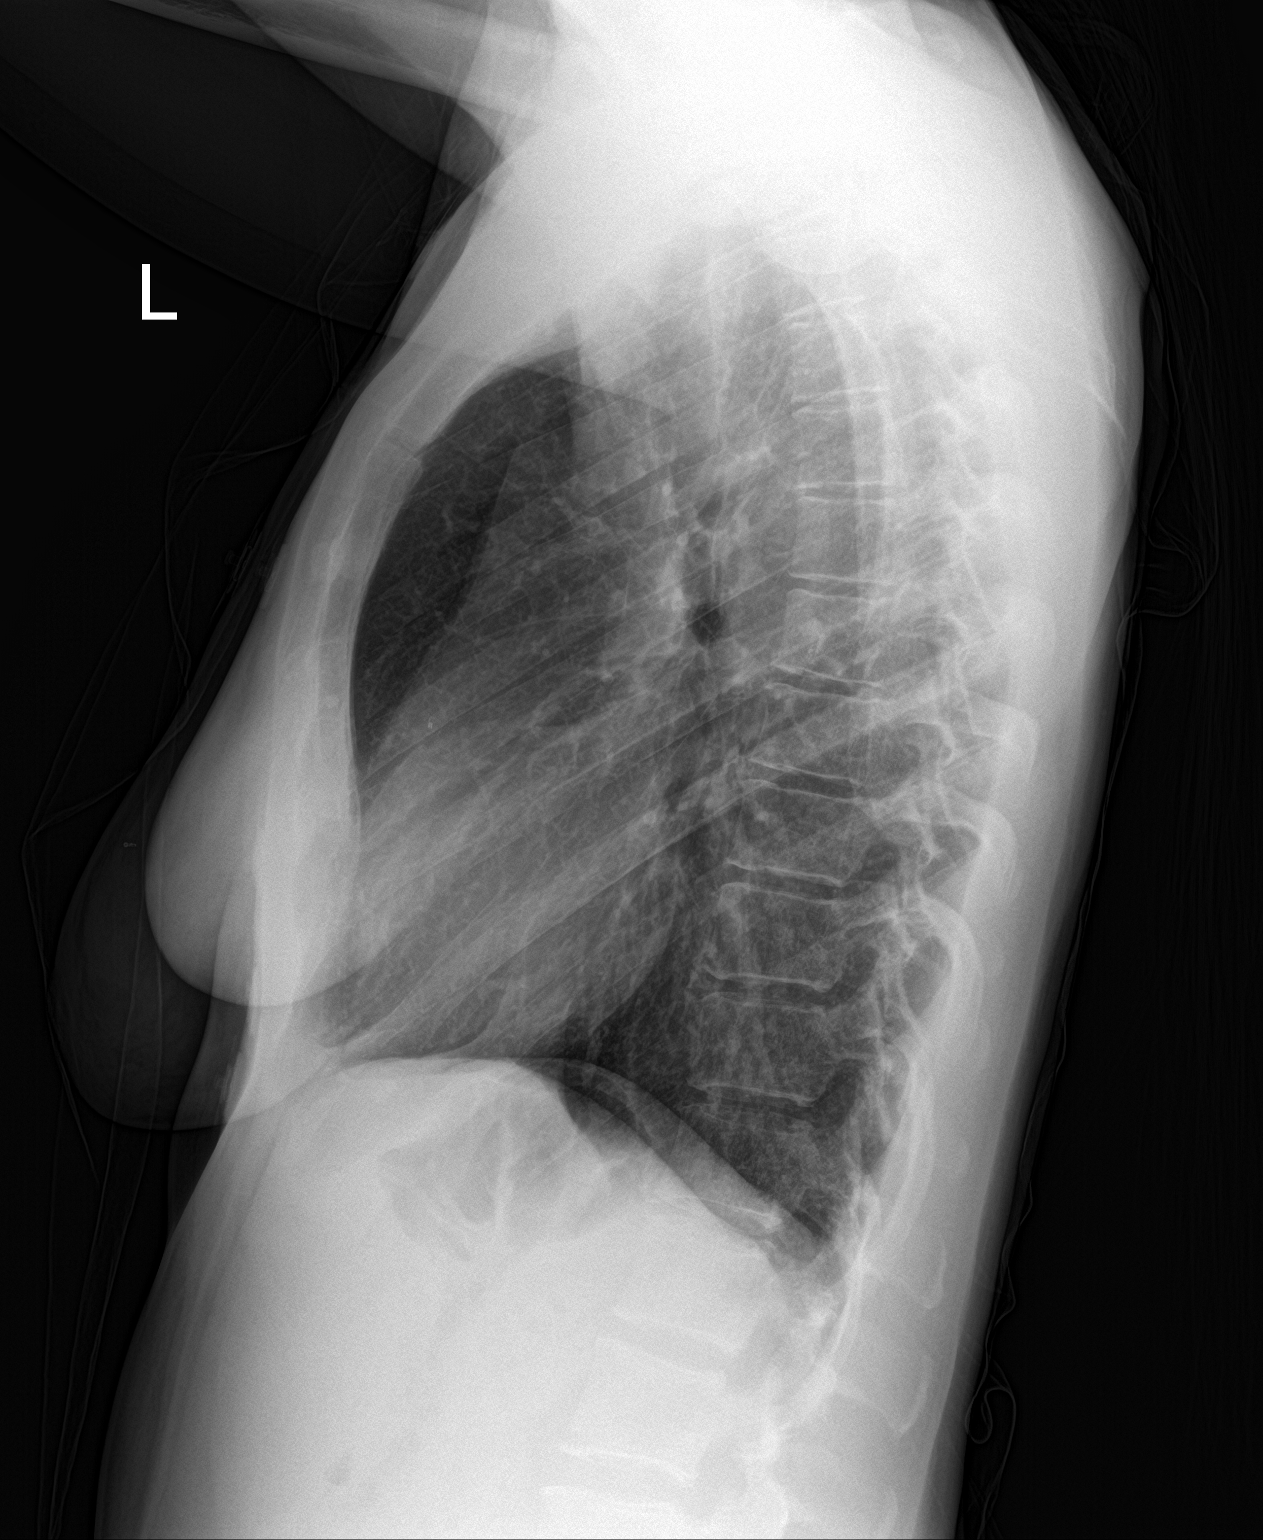

[2 of 2 positions shown; findings below may reference images not displayed]

FINDINGS: Trachea is midline. Cardiomediastinal contours and hilar structures
are normal.

Lungs are clear. EKG leads sticker projects over the chest in the
RIGHT upper chest. No sign of pleural effusion.

On limited assessment no acute skeletal process.
IMPRESSION: No active cardiopulmonary disease.

## 2022-09-30 ENCOUNTER — Ambulatory Visit (INDEPENDENT_AMBULATORY_CARE_PROVIDER_SITE_OTHER): Payer: Commercial Managed Care - PPO

## 2022-09-30 ENCOUNTER — Encounter: Payer: Self-pay | Admitting: Podiatry

## 2022-09-30 ENCOUNTER — Ambulatory Visit (INDEPENDENT_AMBULATORY_CARE_PROVIDER_SITE_OTHER): Payer: Commercial Managed Care - PPO | Admitting: Podiatry

## 2022-09-30 DIAGNOSIS — G5761 Lesion of plantar nerve, right lower limb: Secondary | ICD-10-CM | POA: Diagnosis not present

## 2022-09-30 DIAGNOSIS — M79671 Pain in right foot: Secondary | ICD-10-CM

## 2022-09-30 DIAGNOSIS — M84374A Stress fracture, right foot, initial encounter for fracture: Secondary | ICD-10-CM

## 2022-09-30 NOTE — Progress Notes (Signed)
  Subjective:  Patient ID: Sue Lopez, female    DOB: 1970/07/05,   MRN: [id number]  Chief Complaint  Patient presents with   Foot Pain    Right foot pain on going patient states foot is still getting better. But is still painful.     52 y.o. female presents for follow-up of right foot pain . She has been wearing the CAM boot. Relates she wore the boot for the first three weeks but stopped wearing after the last week. Relates as long as she avoid pressing on the top of her foot or walking on the side she does ok in her regular shoes. Relates still getting burning and hurst when she squeeze her foot. .  . Denies any other pedal complaints. Denies n/v/f/c.   Past Medical History:  Diagnosis Date   ACL (anterior cruciate ligament) tear 10/18/1986   Chickenpox    Elevated bilirubin 07/10/2017   Family Hx of alcoholism, both parents 06/05/2018   Genital warts    Hyperbilirubinemia 06/05/2018   Irregular menses 06/05/2018   Vitamin D deficiency 07/10/2017    Objective:  Physical Exam: Vascular: DP/PT pulses 2/4 bilateral. CFT <3 seconds. Normal hair growth on digits. No edema.  Skin. No lacerations or abrasions bilateral feet.  Musculoskeletal: MMT 5/5 bilateral lower extremities in DF, PF, Inversion and Eversion. Deceased ROM in DF of ankle joint. Tender to third interspace upon palpation some pain over fourth and fifth MPJ. Pain with metatarsal squeeze. Positive mulders click. More pain noted over the third and fourth metatarsal today with ecchymosis noted over the dorsum of the foot.  Neurological: Sensation intact to light touch.   Assessment:   1. Right foot pain   2. Stress reaction of right foot, initial encounter   3. Morton neuroma, right        Plan:  Patient was evaluated and treated and all questions answered. Discussed neuroma and treatment options with patient.  Radiographs reviewed and discussed with patient. No acute fractures or dislocations. Some periosteal reaction  noted around third and fourth metatarsals.  -Discussed treatement options for stress fracture and nueroma; risks, alternatives, and benefits explained. At this point not clear if more of a stress fracture possibly or neuroma. Will order MRI to further evaluate and for possible surgical planning  MRI ordered.  -CAM boot as needed for pain at this time.  -Recommend protection, rest, ice, elevation daily until symptoms improve -Rx pain med/antinflammatories as needed -Patient to return to office after MRI.       Louann Sjogren, DPM

## 2022-10-06 ENCOUNTER — Other Ambulatory Visit: Payer: Self-pay

## 2022-10-06 ENCOUNTER — Encounter (HOSPITAL_COMMUNITY): Payer: Self-pay | Admitting: *Deleted

## 2022-10-06 ENCOUNTER — Emergency Department (HOSPITAL_COMMUNITY)
Admission: EM | Admit: 2022-10-06 | Discharge: 2022-10-06 | Disposition: A | Discharge status: Home / Self Care | Payer: Commercial Managed Care - PPO | Attending: Emergency Medicine | Admitting: Emergency Medicine

## 2022-10-06 ENCOUNTER — Emergency Department (HOSPITAL_BASED_OUTPATIENT_CLINIC_OR_DEPARTMENT_OTHER): Payer: Commercial Managed Care - PPO

## 2022-10-06 ENCOUNTER — Emergency Department (HOSPITAL_COMMUNITY): Payer: Commercial Managed Care - PPO

## 2022-10-06 DIAGNOSIS — M7989 Other specified soft tissue disorders: Secondary | ICD-10-CM | POA: Insufficient documentation

## 2022-10-06 DIAGNOSIS — R2231 Localized swelling, mass and lump, right upper limb: Secondary | ICD-10-CM | POA: Diagnosis present

## 2022-10-06 LAB — URINALYSIS, ROUTINE W REFLEX MICROSCOPIC
Bilirubin Urine: NEGATIVE
Glucose, UA: NEGATIVE mg/dL
Hgb urine dipstick: NEGATIVE
Ketones, ur: NEGATIVE mg/dL
Leukocytes,Ua: NEGATIVE
Nitrite: NEGATIVE
Protein, ur: NEGATIVE mg/dL
Specific Gravity, Urine: 1.005 (ref 1.005–1.030)
pH: 7 (ref 5.0–8.0)

## 2022-10-06 LAB — COMPREHENSIVE METABOLIC PANEL
ALT: 42 U/L (ref 0–44)
AST: 71 U/L — ABNORMAL HIGH (ref 15–41)
Albumin: 3.8 g/dL (ref 3.5–5.0)
Alkaline Phosphatase: 35 U/L — ABNORMAL LOW (ref 38–126)
Anion gap: 9 (ref 5–15)
BUN: 13 mg/dL (ref 6–20)
CO2: 23 mmol/L (ref 22–32)
Calcium: 8.9 mg/dL (ref 8.9–10.3)
Chloride: 105 mmol/L (ref 98–111)
Creatinine, Ser: 0.85 mg/dL (ref 0.44–1.00)
GFR, Estimated: >60 mL/min (ref >60)
Glucose, Bld: 101 mg/dL — ABNORMAL HIGH (ref 70–99)
Potassium: 4 mmol/L (ref 3.5–5.1)
Sodium: 137 mmol/L (ref 135–145)
Total Bilirubin: 1.4 mg/dL — ABNORMAL HIGH (ref 0.3–1.2)
Total Protein: 6.4 g/dL — ABNORMAL LOW (ref 6.5–8.1)

## 2022-10-06 LAB — CBC WITH DIFFERENTIAL/PLATELET
Abs Immature Granulocytes: 0.01 10*3/uL (ref 0.00–0.07)
Basophils Absolute: 0 10*3/uL (ref 0.0–0.1)
Basophils Relative: 1 %
Eosinophils Absolute: 0.1 10*3/uL (ref 0.0–0.5)
Eosinophils Relative: 1 %
HCT: 43 % (ref 36.0–46.0)
Hemoglobin: 14 g/dL (ref 12.0–15.0)
Immature Granulocytes: 0 %
Lymphocytes Relative: 30 %
Lymphs Abs: 1.6 10*3/uL (ref 0.7–4.0)
MCH: 29.8 pg (ref 26.0–34.0)
MCHC: 32.6 g/dL (ref 30.0–36.0)
MCV: 91.5 fL (ref 80.0–100.0)
Monocytes Absolute: 0.4 10*3/uL (ref 0.1–1.0)
Monocytes Relative: 7 %
Neutro Abs: 3.3 10*3/uL (ref 1.7–7.7)
Neutrophils Relative %: 61 %
Platelets: 231 10*3/uL (ref 150–400)
RBC: 4.7 MIL/uL (ref 3.87–5.11)
RDW: 11.9 % (ref 11.5–15.5)
WBC: 5.3 10*3/uL (ref 4.0–10.5)
nRBC: 0 % (ref 0.0–0.2)

## 2022-10-06 LAB — CK: Total CK: 1568 U/L — ABNORMAL HIGH (ref 38–234)

## 2022-10-06 LAB — I-STAT BETA HCG BLOOD, ED (MC, WL, AP ONLY): I-stat hCG, quantitative: <5 m[IU]/mL (ref <5)

## 2022-10-06 LAB — MAGNESIUM: Magnesium: 2.2 mg/dL (ref 1.7–2.4)

## 2022-10-06 MED ORDER — LACTATED RINGERS IV BOLUS
1000.0000 mL | Freq: Once | INTRAVENOUS | Status: AC
  Administered 2022-10-06: 1000 mL via INTRAVENOUS

## 2022-10-06 MED ORDER — METHOCARBAMOL 500 MG PO TABS: 500.0000 mg | ORAL_TABLET | Freq: Two times a day (BID) | ORAL | 0 refills | Status: DC | PRN

## 2022-10-06 NOTE — ED Triage Notes (Signed)
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

## 2022-10-06 NOTE — Discharge Instructions (Addendum)
You were seen in the ER for evaluation of your right upper extremity swelling and pain.  Your ultrasound was negative for a blood clot.  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  This is likely muscular strain or injury due to your overworking.  I would like for you to call sports medicine to follow-up with them.  I included the information for Dr. Clare Gandy into this discharge paperwork.  Please make sure you call to schedule an appointment as soon as possible for reevaluation.  I would like for make sure you are drinking plenty of fluids, mainly water and stay well-hydrated.  I recommend Tylenol at 1000 mg every 6 hours as needed for pain and swelling.  I am also giving you an Ace bandage to help with the swelling. You do not need to sleep or shower with this on and can be used for comfort.  If you have any worsening swelling, pain, color changes, fever, numbness, tingling, weakness, please return to the nearest emergency department for evaluation.  If you have any concerns, new or worsening symptoms, please return to the nearest ER for re-evaluation.   Contact a health care provider if: You have pain, cramps, numbness, or tingling in your arm or hand. Your arm or hand often feels tired. Your arm turns into a darker and different skin color than usual. Your hand feels cold. You have frequent headaches or neck pain. You have muscle loss in your hand. Get help right away if: You lose feeling in your arm or hand. You cannot move your fingers. Your fingers turn into a dark color.

## 2022-10-06 NOTE — ED Provider Notes (Signed)
[place] EMERGENCY DEPARTMENT AT [facility] Provider Note   CSN: [id number] Arrival date & time: 10/06/22  4098     History Chief Complaint  Patient presents with   Arm Injury    Sue Lopez is a 52 y.o. female.   Arm Injury      Home Medications Prior to Admission medications   Medication Sig Start Date End Date Taking? Authorizing Provider  acetaminophen (TYLENOL) 325 MG tablet Take 650 mg by mouth as needed.    [provider]  cholecalciferol (VITAMIN D3) 25 MCG (1000 UNIT) tablet Take 1,000 Units by mouth daily.    [provider]  citalopram (CELEXA) 10 MG tablet TAKE 1 TABLET(10 MG) BY MOUTH DAILY 07/02/22   Allwardt, Alyssa M, PA-C  fluticasone (FLONASE) 50 MCG/ACT nasal spray Place 2 sprays into both nostrils daily. 03/25/22   Allwardt, Crist Infante, PA-C  meclizine (ANTIVERT) 12.5 MG tablet Take 1 -2 tabs po up to three times daily as needed for dizziness. 03/25/22   Allwardt, Crist Infante, PA-C  meloxicam (MOBIC) 15 MG tablet TAKE 1 TABLET(15 MG) BY MOUTH DAILY 09/05/22   Louann Sjogren, DPM      Allergies    Patient has no known allergies.    Review of Systems   Review of Systems  Physical Exam Updated Vital Signs BP 96/62   Pulse (!) 58   Temp 98.2 F (36.8 C)   Resp 18   Ht 5\' 4"  (1.626 m)   Wt 72.6 kg   SpO2 100%   BMI 27.46 kg/m  Physical Exam  ED Results / Procedures / Treatments   Labs (all labs ordered are listed, but only abnormal results are displayed) Labs Reviewed  COMPREHENSIVE METABOLIC PANEL - Abnormal; Notable for the following components:      Result Value   Glucose, Bld 101 (*)    Total Protein 6.4 (*)    AST 71 (*)    Alkaline Phosphatase 35 (*)    Total Bilirubin 1.4 (*)    All other components within normal limits  CK - Abnormal; Notable for the following components:   Total CK 1,568 (*)    All other components within normal limits  CBC WITH DIFFERENTIAL/PLATELET  MAGNESIUM  URINALYSIS, ROUTINE W  REFLEX MICROSCOPIC  I-STAT BETA HCG BLOOD, ED ([facility], [facility], [facility] ONLY)    EKG None  Radiology UE Venous Duplex ([facility] and [facility] ONLY)  Result Date: 10/06/2022 UPPER VENOUS STUDY  Patient Name:  Sue Lopez  Date of Exam:   10/06/2022 Medical Rec #: [id number]    Accession #:    [id number] Date of Birth: 01-05-1971    Patient Gender: F Patient Age:   30 years Exam Location:  [facility] Procedure:      VAS [place] UPPER EXTREMITY VENOUS DUPLEX Referring Phys: Jazlene Bares --------------------------------------------------------------------------------  Indications: Swelling, and Pain and swollen right elbow radiating up the arm for several days. Performing Technologist: Marilynne Halsted RDMS, RVT  Examination Guidelines: A complete evaluation includes B-mode imaging, spectral Doppler, color Doppler, and power Doppler as needed of all accessible portions of each vessel. Bilateral testing is considered an integral part of a complete examination. Limited examinations for reoccurring indications may be performed as noted.  Right Findings: +----------+------------+---------+-----------+----------+-------+ RIGHT     CompressiblePhasicitySpontaneousPropertiesSummary +----------+------------+---------+-----------+----------+-------+ IJV           Full       Yes       Yes                      +----------+------------+---------+-----------+----------+-------+  Subclavian    Full       Yes       Yes                      +----------+------------+---------+-----------+----------+-------+ Axillary      Full       Yes       Yes                      +----------+------------+---------+-----------+----------+-------+ Brachial      Full                                          +----------+------------+---------+-----------+----------+-------+ Radial        Full                                          +----------+------------+---------+-----------+----------+-------+ Ulnar         Full                                           +----------+------------+---------+-----------+----------+-------+ Cephalic      Full                                          +----------+------------+---------+-----------+----------+-------+ Basilic       Full                                          +----------+------------+---------+-----------+----------+-------+ Right elblow subcutaneous edema noted.  Left Findings: +----------+------------+---------+-----------+----------+-------+ LEFT      CompressiblePhasicitySpontaneousPropertiesSummary +----------+------------+---------+-----------+----------+-------+ Subclavian               Yes       Yes                      +----------+------------+---------+-----------+----------+-------+  Summary:  Right: No evidence of deep vein thrombosis in the upper extremity. No evidence of superficial vein thrombosis in the upper extremity.  Left: No evidence of thrombosis in the subclavian.  *See table(s) above for measurements and observations.  Diagnosing physician: Sherald Hess MD Electronically signed by Sherald Hess MD on 10/06/2022 at 10:46:24 AM.    Final    DG Wrist Complete Right  Result Date: 10/06/2022 CLINICAL DATA:  Pain EXAM: RIGHT WRIST - COMPLETE 3+ VIEW COMPARISON:  None Available. FINDINGS: No fracture or dislocation is seen. There is 5 mm low-density with sclerotic margins in the distal portion of scaphoid, possibly a bone cyst. Small bony spurs are seen in first carpometacarpal joint. Deformity in the shaft of first metacarpal may be residual from previous injury. IMPRESSION: No fracture or dislocation is seen. There is 5 mm low-density with sclerotic margins in scaphoid, possibly bone cyst. Electronically Signed   By: Ernie Avena M.D.   On: 10/06/2022 08:27   DG Forearm Right  Result Date: 10/06/2022 CLINICAL DATA:  Pain EXAM: RIGHT FOREARM - 2 VIEW COMPARISON:  None Available. FINDINGS: No fracture or dislocation is  seen. There are  no focal lytic lesions. There is subcutaneous edema along the dorsal and medial aspects. No opaque foreign bodies are seen. IMPRESSION: No fracture or dislocation is seen. There is edema in subcutaneous plane along the dorsal and medial aspects suggesting contusion or cellulitis. Electronically Signed   By: Ernie Avena M.D.   On: 10/06/2022 08:25   DG Humerus Right  Result Date: 10/06/2022 CLINICAL DATA:  Pain EXAM: RIGHT HUMERUS - 2+ VIEW COMPARISON:  None Available. FINDINGS: There is no evidence of fracture or other focal bone lesions. Soft tissues are unremarkable. IMPRESSION: No radiographic abnormality is seen in right humerus. Electronically Signed   By: Ernie Avena M.D.   On: 10/06/2022 08:23    Procedures Procedures   Medications Ordered in ED Medications  lactated ringers bolus 1,000 mL (0 mLs Intravenous Stopped 10/06/22 1217)    ED Course/ Medical Decision Making/ A&P                           Medical Decision Making Amount and/or Complexity of Data Reviewed Labs: ordered. Radiology: ordered.   52 y.o. female presents to the ER for evaluation of right upper extremity swelling. Differential diagnosis includes but is not limited to compartment syndrome, rhabdomyolysis, muscle injury, joint effusion, DVT. Vital signs ***. Physical exam as noted above.   I independently reviewed and interpreted the patient's labs. ***.  After consideration of the diagnostic results and the patients response to treatment, I feel that *** .   Emergency department workup does*** not suggest an emergent condition requiring admission or immediate intervention beyond what has been performed at this time.   ***We discussed the results of the labs/imaging. The plan is ***. We discussed strict return precautions and red flag symptoms. The patient verbalized their understanding and agrees to the plan. The patient is stable and being discharged home in good condition.  I  discussed this case with my attending physician who cosigned this note including patient's presenting symptoms, physical exam, and planned diagnostics and interventions. Attending physician stated agreement with plan or made changes to plan which were implemented.   Attending physician assessed patient at bedside.  Portions of this report may have been transcribed using voice recognition software. Every effort was made to ensure accuracy; however, inadvertent computerized transcription errors may be present.   Final Clinical Impression(s) / ED Diagnoses Final diagnoses:  None    Rx / DC Orders ED Discharge Orders     None

## 2022-10-06 NOTE — Progress Notes (Signed)
Right arm venous duplex  has been completed. Refer to [facility] under chart review to view preliminary results.   10/06/2022  10:06 AM Sue Lopez, Sue Lopez

## 2022-10-06 NOTE — ED Notes (Signed)
Vascular US at bedside.

## 2022-10-08 ENCOUNTER — Ambulatory Visit (INDEPENDENT_AMBULATORY_CARE_PROVIDER_SITE_OTHER): Payer: Commercial Managed Care - PPO | Admitting: Sports Medicine

## 2022-10-08 ENCOUNTER — Other Ambulatory Visit: Payer: Self-pay

## 2022-10-08 VITALS — BP 114/64 | Ht 64.0 in | Wt 160.0 lb

## 2022-10-08 DIAGNOSIS — M25521 Pain in right elbow: Secondary | ICD-10-CM

## 2022-10-08 MED ORDER — MELOXICAM 15 MG PO TABS
15.0000 mg | ORAL_TABLET | Freq: Every day | ORAL | 2 refills | Status: DC
Start: 2022-10-08 — End: 2023-02-12

## 2022-10-08 NOTE — Progress Notes (Unsigned)
   New patient Office Visit  Subjective   Patient ID: Sue Lopez, female    DOB: Sep 03, 1970  Age: 52 y.o. MRN: [id number]  Right elbow pain and swelling.  Patient is here today with chief complaint of right elbow pain and swelling.  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  She noticed some slight elbow pain that worsened throughout the following days.  She was evaluated in the ER for diffuse right arm swelling.  DVT study was negative, labs without obvious abnormality.  She was sent home with referral to sports medicine.  She states her pain is slightly improved however she has some discomfort with full elbow flexion.   ROS as listed above in HPI    Objective:     BP 114/64   Ht 5\' 4"  (1.626 m)   Wt 160 lb (72.6 kg)   BMI 27.46 kg/m   Physical Exam Vitals reviewed.  Constitutional:      General: She is not in acute distress.    Appearance: Normal appearance. She is not ill-appearing, toxic-appearing or diaphoretic.  Pulmonary:     Effort: Pulmonary effort is normal.  Neurological:     Mental Status: She is alert.   Right elbow: No obvious deformity.  She does have some swelling along the medial aspect of her forearm.  No ecchymosis.  Tenderness to palpation at the medial epicondyle and posterior elbow.  Full range of motion with elbow flexion and extension.  Full range of motion at the wrist with flexion and extension.  Strength 5/5 resisted wrist flexion and extension.  Decreased strength 4/5 resisted elbow extension.  Sensation intact to light touch.  Grip strength 5/5 bilaterally.  Radial pulse 2+  Limited ultrasound right elbow Medial epicondyle, flexor tendon insertion was visualized.  No obvious hypoechoic changes within the tendon or hypoechoic fluid surrounding the area.  Tendon fibers appear to be intact. Lateral epicondyles, extensor tendon insertion was visualized.  No obvious hypoechoic changes within the  tendon or hypoechoic fluid surrounding the area.  Tendon fibers appear to be intact. Triceps tendon, insertion site was visualized.  There is some hypoechoic change at the musculotendinous junction of the triceps seen in both horizontal and longitudinal axis. Impression: Partial tear of the triceps muscle.    Assessment & Plan:   Problem List Items Addressed This Visit       Other   Right elbow pain - Primary    Clinical and radiographical, ultrasound evidence of partial triceps tear.  Recommend patient use compression, ice, anti-inflammatory, meloxicam for the next 10 to 14 days to assist with pain and swelling..  We will follow-up with her in 2 weeks to ensure that she is having resolution of her symptoms.  At that time we will likely progress her to some strengthening exercises.  She verbalized understanding.      Relevant Orders   [place] LIMITED JOINT SPACE STRUCTURES UP RIGHT    Return in about 2 weeks (around 10/22/2022), or if symptoms worsen or fail to improve.    Claudie Leach, DO

## 2022-10-08 NOTE — Assessment & Plan Note (Signed)
Clinical and radiographical, ultrasound evidence of partial triceps tear.  Recommend patient use compression, ice, anti-inflammatory, meloxicam for the next 10 to 14 days to assist with pain and swelling..  We will follow-up with her in 2 weeks to ensure that she is having resolution of her symptoms.  At that time we will likely progress her to some strengthening exercises.  She verbalized understanding.

## 2022-10-16 ENCOUNTER — Encounter: Payer: Self-pay | Admitting: Podiatry

## 2022-10-17 ENCOUNTER — Other Ambulatory Visit: Payer: Commercial Managed Care - PPO

## 2022-10-22 ENCOUNTER — Ambulatory Visit (INDEPENDENT_AMBULATORY_CARE_PROVIDER_SITE_OTHER): Payer: Commercial Managed Care - PPO | Admitting: Sports Medicine

## 2022-10-22 ENCOUNTER — Other Ambulatory Visit: Payer: Self-pay

## 2022-10-22 VITALS — BP 118/70 | Ht 64.0 in | Wt 160.0 lb

## 2022-10-22 DIAGNOSIS — M25521 Pain in right elbow: Secondary | ICD-10-CM | POA: Diagnosis not present

## 2022-10-22 NOTE — Progress Notes (Signed)
PCP: Allwardt, Crist Infante, PA-C  Subjective:   HPI: Patient is a 52 y.o. female here for follow-up of a partial triceps tear about 2 weeks ago.  She has been resting the joint and has had marked improvement in her edema and also notes that her pain is much improved.  She took meloxicam for the first week or so but has since discontinued this as she was no longer needing it.  Has been doing some unweighted range of motion exercises.  Her only concern today is that while her tricep is feeling much better, her lateral epicondyles pain is unchanged from her last exam.  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  Past Medical History:  Diagnosis Date   ACL (anterior cruciate ligament) tear 10/18/1986   Chickenpox    Elevated bilirubin 07/10/2017   Family Hx of alcoholism, both parents 06/05/2018   Genital warts    Hyperbilirubinemia 06/05/2018   Irregular menses 06/05/2018   Vitamin D deficiency 07/10/2017    Current Outpatient Medications on File Prior to Visit  Medication Sig Dispense Refill   acetaminophen (TYLENOL) 325 MG tablet Take 650 mg by mouth as needed.     cholecalciferol (VITAMIN D3) 25 MCG (1000 UNIT) tablet Take 1,000 Units by mouth daily.     citalopram (CELEXA) 10 MG tablet TAKE 1 TABLET(10 MG) BY MOUTH DAILY 90 tablet 3   fluticasone (FLONASE) 50 MCG/ACT nasal spray Place 2 sprays into both nostrils daily. 16 g 0   meclizine (ANTIVERT) 12.5 MG tablet Take 1 -2 tabs po up to three times daily as needed for dizziness. 30 tablet 0   meloxicam (MOBIC) 15 MG tablet Take 1 tablet (15 mg total) by mouth daily. 30 tablet 2   methocarbamol (ROBAXIN) 500 MG tablet Take 1 tablet (500 mg total) by mouth 2 (two) times daily as needed for muscle spasms. 10 tablet 0   No current facility-administered medications on file prior to visit.    Past Surgical History:  Procedure Laterality Date   ANTERIOR CRUCIATE LIGAMENT REPAIR Right 1989    BREAST SURGERY  04/18/2017   VARICOSE VEIN SURGERY Right     No Known Allergies  BP 118/70   Ht 5\' 4"  (1.626 m)   Wt 160 lb (72.6 kg)   BMI 27.46 kg/m      Objective:  Physical Exam:  Gen: NAD, comfortable in exam room MSK: Right elbow without edema, deformity, or bruising.  She does have mild tenderness to palpation along the posterior elbow as well as lateral epicondyle.  She has full range of motion about the joint without crepitus.  She has 5 out of 5 strength about both the elbow and the wrist.  Neurovascularly intact distally.   Assessment & Plan:  1.  Right partial triceps tear-  symptomatically much improved. Remains with hypoechoic changes about the muscle belly on [place], tendon appears intact. Will move forward with home strengthening exercises and slow return to activity. Follow-up in four weeks.  2.  Lateral epicondylitis-  mild-to-moderate symptoms. Unremarkable US findings with extensor tendon intact. Will provide with tennis elbow exercises.  Follow-up in 4 weeks as above.   I saw and evaluated this patient separate from the medical resident.  I agree with his exam, assessment and plan.  She should work on the stretches and exercises daily and follow-up with [place] in 4 weeks if her symptoms worsen or fail to improve.  Marcelino Duster  Oretha Milch, DO PGY 4, [facility] Fellow   Addendum:  I was the preceptor for this visit and available for immediate consultation.  Norton Blizzard MD Marrianne Mood

## 2022-10-27 ENCOUNTER — Ambulatory Visit: Payer: Commercial Managed Care - PPO | Admitting: Podiatry

## 2022-11-26 ENCOUNTER — Ambulatory Visit: Payer: Commercial Managed Care - PPO | Admitting: Obstetrics and Gynecology

## 2022-12-11 ENCOUNTER — Ambulatory Visit: Payer: Commercial Managed Care - PPO | Admitting: Obstetrics and Gynecology

## 2022-12-16 ENCOUNTER — Encounter: Payer: Self-pay | Admitting: Podiatry

## 2022-12-17 ENCOUNTER — Other Ambulatory Visit: Payer: Self-pay | Admitting: Podiatry

## 2022-12-17 ENCOUNTER — Encounter (INDEPENDENT_AMBULATORY_CARE_PROVIDER_SITE_OTHER): Payer: Self-pay

## 2022-12-17 DIAGNOSIS — M79671 Pain in right foot: Secondary | ICD-10-CM

## 2022-12-17 DIAGNOSIS — G5761 Lesion of plantar nerve, right lower limb: Secondary | ICD-10-CM

## 2022-12-17 DIAGNOSIS — M84374A Stress fracture, right foot, initial encounter for fracture: Secondary | ICD-10-CM

## 2022-12-25 ENCOUNTER — Ambulatory Visit: Payer: Commercial Managed Care - PPO | Admitting: Physician Assistant

## 2022-12-31 ENCOUNTER — Encounter: Payer: Commercial Managed Care - PPO | Admitting: Physician Assistant

## 2023-01-05 ENCOUNTER — Ambulatory Visit
Admission: RE | Admit: 2023-01-05 | Discharge: 2023-01-05 | Disposition: A | Discharge status: Home / Self Care | Payer: Commercial Managed Care - PPO | Source: Ambulatory Visit | Attending: Podiatry | Admitting: Podiatry

## 2023-01-05 DIAGNOSIS — M79671 Pain in right foot: Secondary | ICD-10-CM

## 2023-01-05 DIAGNOSIS — G5761 Lesion of plantar nerve, right lower limb: Secondary | ICD-10-CM

## 2023-01-05 DIAGNOSIS — M84374A Stress fracture, right foot, initial encounter for fracture: Secondary | ICD-10-CM

## 2023-02-12 ENCOUNTER — Ambulatory Visit: Payer: Commercial Managed Care - PPO | Admitting: Obstetrics and Gynecology

## 2023-02-12 ENCOUNTER — Encounter: Payer: Self-pay | Admitting: Obstetrics and Gynecology

## 2023-02-12 ENCOUNTER — Other Ambulatory Visit (HOSPITAL_COMMUNITY)
Admission: RE | Admit: 2023-02-12 | Discharge: 2023-02-12 | Disposition: A | Discharge status: Home / Self Care | Payer: Commercial Managed Care - PPO | Source: Ambulatory Visit | Attending: Obstetrics and Gynecology | Admitting: Obstetrics and Gynecology

## 2023-02-12 VITALS — BP 104/68 | HR 80 | Ht 63.75 in | Wt 164.0 lb

## 2023-02-12 DIAGNOSIS — Z01419 Encounter for gynecological examination (general) (routine) without abnormal findings: Secondary | ICD-10-CM | POA: Insufficient documentation

## 2023-02-12 DIAGNOSIS — Z1231 Encounter for screening mammogram for malignant neoplasm of breast: Secondary | ICD-10-CM

## 2023-02-12 DIAGNOSIS — L299 Pruritus, unspecified: Secondary | ICD-10-CM | POA: Diagnosis not present

## 2023-02-12 LAB — WET PREP FOR TRICH, YEAST, CLUE

## 2023-02-12 MED ORDER — FLUCONAZOLE 150 MG PO TABS: 150.0000 mg | ORAL_TABLET | Freq: Once | ORAL | 0 refills | Status: AC

## 2023-02-12 NOTE — Progress Notes (Signed)
52 y.o. y.o. female here for annual exam.  Patient's last menstrual period was 01/17/2023 (exact date). Period Duration (Days): 4 Period Pattern: Regular Menstrual Control: Maxi pad Dysmenorrhea: (!) Mild Dysmenorrhea Symptoms: Cramping  Pelvic discharge: reports pruritus near anus  Last mammogram: 2023 Last colonoscopy: 2023 cologuard  Blood pressure 104/68, pulse 80, height 5' 3.75" (1.619 m), weight 164 lb (74.4 kg), last menstrual period 01/17/2023, SpO2 98%.     Component Value Date/Time   DIAGPAP  11/13/2021 0831    - Negative for intraepithelial lesion or malignancy (NILM)   DIAGPAP  11/08/2020 0835    - Negative for intraepithelial lesion or malignancy (NILM)   DIAGPAP  04/27/2019 1518    - Negative for intraepithelial lesion or malignancy (NILM)   HPVHIGH Positive (A) 11/13/2021 0831   HPVHIGH Positive (A) 11/08/2020 0835   HPVHIGH Positive (A) 04/27/2019 1518   ADEQPAP  11/13/2021 0831    Satisfactory for evaluation; transformation zone component PRESENT.   ADEQPAP  11/08/2020 0835    Satisfactory for evaluation; transformation zone component PRESENT.   ADEQPAP  04/27/2019 1518    Satisfactory for evaluation; transformation zone component PRESENT.   Last pap in 6/23, negative with +HPV. Colpo CIN I. GYN HISTORY:    Component Value Date/Time   DIAGPAP  11/13/2021 0831    - Negative for intraepithelial lesion or malignancy (NILM)   DIAGPAP  11/08/2020 0835    - Negative for intraepithelial lesion or malignancy (NILM)   DIAGPAP  04/27/2019 1518    - Negative for intraepithelial lesion or malignancy (NILM)   HPVHIGH Positive (A) 11/13/2021 0831   HPVHIGH Positive (A) 11/08/2020 0835   HPVHIGH Positive (A) 04/27/2019 1518   ADEQPAP  11/13/2021 0831    Satisfactory for evaluation; transformation zone component PRESENT.   ADEQPAP  11/08/2020 0835    Satisfactory for evaluation; transformation zone component PRESENT.   ADEQPAP  04/27/2019 1518     Satisfactory for evaluation; transformation zone component PRESENT.    OB History  Gravida Para Term Preterm AB Living  0 0 0 0 0 0  SAB IAB Ectopic Multiple Live Births  0 0 0 0 0    Past Medical History:  Diagnosis Date   ACL (anterior cruciate ligament) tear 10/18/1986   Chickenpox    Elevated bilirubin 07/10/2017   Family Hx of alcoholism, both parents 06/05/2018   Genital warts    Hyperbilirubinemia 06/05/2018   Irregular menses 06/05/2018   Vitamin D deficiency 07/10/2017    Past Surgical History:  Procedure Laterality Date   ANTERIOR CRUCIATE LIGAMENT REPAIR Right 1989   BREAST SURGERY  04/18/2017   VARICOSE VEIN SURGERY Right     Current Outpatient Medications on File Prior to Visit  Medication Sig Dispense Refill   acetaminophen (TYLENOL) 325 MG tablet Take 650 mg by mouth as needed.     citalopram (CELEXA) 10 MG tablet TAKE 1 TABLET(10 MG) BY MOUTH DAILY 90 tablet 3   IBUPROFEN PO Take by mouth. Prn     No current facility-administered medications on file prior to visit.    Social History   Socioeconomic History   Marital status: Married    Spouse name: Victorino Dike   Number of children: Not on file   Years of education: Not on file   Highest education level: Bachelor's degree (e.g., BA, AB, BS)  Occupational History   Occupation: Marine scientist    Employer: [employer]   Tobacco Use   Smoking status: Never  Smokeless tobacco: Former    Types: Chew    Quit date: 06/04/1992  Vaping Use   Vaping status: Never Used  Substance and Sexual Activity   Alcohol use: Yes    Comment: occasion glass of wine   Drug use: No   Sexual activity: Yes    Partners: Female    Birth control/protection: None    Comment: female partner  Other Topics Concern   Not on file  Social History Narrative   Active, but no regular exercise. Eats well, mostly Paleo. Likes to drink 1-2 glasses of wine or beer several days per week, but is mindful to watch this because of strong  family history of alcoholism. No children, though she and her wife tried a few years ago. She is happy with current life without children, but admits that her wife struggles with it. 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 Admits that she "hates doctors" due to some poor experiences in the past. It is important to her to feel "heard." Prefers not to take medications if possible, but open if needed. Okay for all preventive maintenance. Mother and father divorced when she was young. Father raised her, non-optimal environment, emotionally absent. She has a difficult time connecting with others, especially physically, but feels that she is an extrovert in that she recharges via time with friends.    Social Determinants of Health   Financial Resource Strain: Low Risk  (04/08/2022)   Overall Financial Resource Strain (CARDIA)    Difficulty of Paying Living Expenses: Not hard at all  Food Insecurity: No Food Insecurity (04/08/2022)   Hunger Vital Sign    Worried About Running Out of Food in the Last Year: Never true    Ran Out of Food in the Last Year: Never true  Transportation Needs: No Transportation Needs (04/08/2022)   PRAPARE - Administrator, Civil Service (Medical): No    Lack of Transportation (Non-Medical): No  Physical Activity: Insufficiently Active (04/08/2022)   Exercise Vital Sign    Days of Exercise per Week: 3 days    Minutes of Exercise per Session: 30 min  Stress: No Stress Concern Present (04/08/2022)   [employer] of Occupational Health - Occupational Stress Questionnaire    Feeling of Stress : Only a little  Social Connections: Socially Isolated (04/08/2022)   Social Connection and Isolation Panel [NHANES]    Frequency of Communication with Friends and Family: Once a week    Frequency of Social Gatherings with Friends and Family: Once a week    Attends Religious Services: Never    Database administrator or Organizations: No     Attends Engineer, structural: Not on file    Marital Status: Married  Catering manager Violence: Not on file    Family History  Problem Relation Age of Onset   Heart Problems Mother    Alcohol abuse Mother    COPD Mother    Hypertension Mother    Alcohol abuse Father    Diabetes Father    Heart Problems Father    Stroke Father    Alcohol abuse Maternal Grandfather    Hypertension Maternal Grandfather    Lung cancer Maternal Grandfather    Heart disease Paternal Grandmother    Hyperlipidemia Paternal Grandmother    Heart disease Paternal Grandfather    Hyperlipidemia Paternal Grandfather      No Known Allergies    Patient's last menstrual period was Patient's last menstrual period was  01/17/2023 (exact date)..           Review of Systems Alls systems reviewed and are negative.     PE General appearance: alert, cooperative and appears stated age Head: Normocephalic, without obvious abnormality, atraumatic Neck: no adenopathy, supple, symmetrical, trachea midline and thyroid normal to inspection and palpation Lungs: clear to auscultation bilaterally Breasts: normal appearance, no masses or tenderness Heart: regular rate and rhythm Abdomen: soft, non-tender; bowel sounds normal; no masses,  no organomegaly Extremities: extremities normal, atraumatic, no cyanosis or edema Skin: Skin color, texture, turgor normal. No rashes or lesions Lymph nodes: Cervical, supraclavicular, and axillary nodes normal. No abnormal inguinal nodes palpated Neurologic: Grossly normal     Pelvic: External genitalia:  no lesions              Urethra:  normal appearing urethra with no masses, tenderness or lesions              Bartholins and Skenes: normal                 Vagina: normal appearing vagina with normal color and discharge c/w YEAST, no lesions.               Cervix: no lesions, no cervical motion tenderness               Bimanual Exam:  Uterus:  normal size, contour,  position, consistency, mobility, non-tender              Adnexa: no mass, fullness, tenderness          Chaperone was present for exam.   A:         Well Woman GYN exam, pruritis                             P:        Pap smear collected             Encouraged annual mammogram screening                      Labs and immunizations with her primary             Discussed breast self exams             Encouraged safe sexual practices and enouraged healthy lifestyle practices with diet and exercise To treat for yeast discharge appearance on exam.  Discussed if pruritus continues and becomes more bothersome to have a punch biopsy done Earley Favor

## 2023-02-23 LAB — CYTOLOGY - PAP
Adequacy: ABSENT
Comment: NEGATIVE
Diagnosis: NEGATIVE
High risk HPV: NEGATIVE

## 2023-03-10 ENCOUNTER — Encounter: Payer: Self-pay | Admitting: Podiatry

## 2023-03-10 ENCOUNTER — Ambulatory Visit (INDEPENDENT_AMBULATORY_CARE_PROVIDER_SITE_OTHER): Payer: Commercial Managed Care - PPO | Admitting: Podiatry

## 2023-03-10 DIAGNOSIS — G5761 Lesion of plantar nerve, right lower limb: Secondary | ICD-10-CM

## 2023-03-10 DIAGNOSIS — M778 Other enthesopathies, not elsewhere classified: Secondary | ICD-10-CM

## 2023-03-10 MED ORDER — DEXAMETHASONE SODIUM PHOSPHATE 120 MG/30ML IJ SOLN
4.0000 mg | Freq: Once | INTRAMUSCULAR | Status: AC
Start: 2023-03-10 — End: 2023-03-10
  Administered 2023-03-10: 4 mg via INTRA_ARTICULAR

## 2023-03-10 MED ORDER — TRIAMCINOLONE ACETONIDE 10 MG/ML IJ SUSP
2.5000 mg | Freq: Once | INTRAMUSCULAR | Status: AC
Start: 2023-03-10 — End: 2023-03-10
  Administered 2023-03-10: 2.5 mg via INTRA_ARTICULAR

## 2023-03-10 NOTE — Progress Notes (Signed)
  Subjective:  Patient ID: Sue Lopez, female    DOB: [birth year],   MRN: [id number]  Chief Complaint  Patient presents with   Foot Pain    RM#22 Right foot pain MRI done over 1 month ago still having foot pain wants to have a care plan put in place.    52 y.o. female presents for follow-up of right foot pain . Relates continued pain in the foot. Did get her MRI a couple months ago and here to review the results.  Denies any other pedal complaints. Denies n/v/f/c.   Past Medical History:  Diagnosis Date   ACL (anterior cruciate ligament) tear 10/18/1986   Chickenpox    Elevated bilirubin 07/10/2017   Family Hx of alcoholism, both parents 06/05/2018   Genital warts    Hyperbilirubinemia 06/05/2018   Irregular menses 06/05/2018   Vitamin D deficiency 07/10/2017    Objective:  Physical Exam: Vascular: DP/PT pulses 2/4 bilateral. CFT <3 seconds. Normal hair growth on digits. No edema.  Skin. No lacerations or abrasions bilateral feet.  Musculoskeletal: MMT 5/5 bilateral lower extremities in DF, PF, Inversion and Eversion. Deceased ROM in DF of ankle joint. Tender to third interspace upon palpation some pain over fourth and fifth MPJ. Pain with metatarsal squeeze. Positive mulders click. More pain noted over the third and fourth metatarsal today with ecchymosis noted over the dorsum of the foot.  Neurological: Sensation intact to light touch. \   MRI right foot  IMPRESSION: 1. Bone marrow edema in the lateral hallux sesamoid as can be seen with sesamoiditis. 2. Small amount of fluid in the first, second and third intermetatarsal bursae as can be seen with intermetatarsal bursitis.   Assessment:   1. Morton neuroma, right   2. Capsulitis of right foot         Plan:  Patient was evaluated and treated and all questions answered. Discussed neuroma and treatment options with patient.  Radiographs reviewed and discussed with patient. No acute fractures or dislocations. Some  periosteal reaction noted around third and fourth metatarsals.  -Discussed treatement options for stress fracture and nueroma; risks, alternatives, and benefits explained. MRI reiewed with patient.  -Injection offered today procedure below.  -Discussed surgical options in the future if needed.  -Rx pain med/antinflammatories as needed -Patient to return to office as needed.   Procedure: Injection Tendon/Ligament Discussed alternatives, risks, complications and verbal consent was obtained.  Location: Right third interspace. Skin Prep: Alcohol. Injectate: 1cc 0.5% marcaine plain, 1 cc dexamethasone 0.5 cc kenalog  Disposition: Patient tolerated procedure well. Injection site dressed with a band-aid.  Post-injection care was discussed and return precautions discussed.        Louann Sjogren, DPM

## 2023-03-27 ENCOUNTER — Encounter: Payer: Self-pay | Admitting: Physician Assistant

## 2023-03-27 ENCOUNTER — Ambulatory Visit (INDEPENDENT_AMBULATORY_CARE_PROVIDER_SITE_OTHER): Payer: Commercial Managed Care - PPO | Admitting: Physician Assistant

## 2023-03-27 VITALS — BP 106/71 | HR 68 | Temp 97.3°F | Ht 63.7 in | Wt 167.4 lb

## 2023-03-27 DIAGNOSIS — R609 Edema, unspecified: Secondary | ICD-10-CM | POA: Diagnosis not present

## 2023-03-27 DIAGNOSIS — Z0000 Encounter for general adult medical examination without abnormal findings: Secondary | ICD-10-CM | POA: Diagnosis not present

## 2023-03-27 DIAGNOSIS — Z23 Encounter for immunization: Secondary | ICD-10-CM

## 2023-03-27 DIAGNOSIS — F411 Generalized anxiety disorder: Secondary | ICD-10-CM

## 2023-03-27 MED ORDER — CITALOPRAM HYDROBROMIDE 10 MG PO TABS
ORAL_TABLET | ORAL | 3 refills | Status: AC
Start: 2023-03-27 — End: ?

## 2023-03-27 MED ORDER — FUROSEMIDE 20 MG PO TABS: 20.0000 mg | ORAL_TABLET | Freq: Every day | ORAL | 2 refills | Status: DC | PRN

## 2023-03-27 NOTE — Progress Notes (Signed)
Subjective:    Patient ID: Sue Lopez, female    DOB: Jan 24, 1971, 52 y.o.   MRN: [id number]  Chief Complaint  Patient presents with   Annual Exam    Pt here for annual exam -- non fasting - Pt would like to discuss Shringrix vaccine   Anxiety    Pt would like to discuss Celexa medication, pt has not been consistent with taking it, has been off this medication for the past couple of months   Weight Check    Pt c/o of weight gain of 20 pounds    HPI Discussed the use of AI scribe software for clinical note transcription with the patient, who gave verbal consent to proceed.  History of Present Illness   The patient, with a history of obsessive-compulsive thoughts & anxiety, managed with Celexa, presents with concerns about potential interactions of Celexa with other medications and vitamins. The patient had discontinued Celexa due to these concerns but is considering restarting the medication due to a return of obsessive symptoms.  The patient also reports recent weight gain, which she attributes to possible fluid retention, as evidenced by swelling in the legs. The patient notes discomfort in the legs, particularly when pressure is applied to certain areas. The patient also reports changes in body temperature regulation, requiring adjustments to her heated and cooled mattress topper.  The patient is experiencing changes in her menstrual cycle, with periods becoming heavier and accompanied by more cramping. The patient also reports intermittent headaches, described as piercing in the eye, lasting for a few days at a time.  The patient is also considering getting the COVID booster and shingles vaccine and inquires about the current guidance on these vaccines.       Past Medical History:  Diagnosis Date   ACL (anterior cruciate ligament) tear 10/18/1986   Chickenpox    Elevated bilirubin 07/10/2017   Family Hx of alcoholism, both parents 06/05/2018   Genital warts     Hyperbilirubinemia 06/05/2018   Irregular menses 06/05/2018   Vitamin D deficiency 07/10/2017    Past Surgical History:  Procedure Laterality Date   ANTERIOR CRUCIATE LIGAMENT REPAIR Right 1989   BREAST SURGERY  04/18/2017   VARICOSE VEIN SURGERY Right     Family History  Problem Relation Age of Onset   Heart Problems Mother    Alcohol abuse Mother    COPD Mother    Hypertension Mother    Alcohol abuse Father    Diabetes Father    Heart Problems Father    Stroke Father    Alcohol abuse Maternal Grandfather    Hypertension Maternal Grandfather    Lung cancer Maternal Grandfather    Heart disease Paternal Grandmother    Hyperlipidemia Paternal Grandmother    Heart disease Paternal Grandfather    Hyperlipidemia Paternal Grandfather     Social History   Tobacco Use   Smoking status: Never   Smokeless tobacco: Former    Types: Chew    Quit date: 06/04/1992  Vaping Use   Vaping status: Never Used  Substance Use Topics   Alcohol use: Yes    Comment: occasion glass of wine   Drug use: No     No Known Allergies  Review of Systems NEGATIVE UNLESS OTHERWISE INDICATED IN HPI      Objective:     BP 106/71   Pulse 68   Temp (!) 97.3 F (36.3 C)   Ht 5' 3.7" (1.618 m)   Wt 167 lb 6.4 oz (  75.9 kg)   SpO2 100%   BMI 29.01 kg/m   Wt Readings from Last 3 Encounters:  03/27/23 167 lb 6.4 oz (75.9 kg)  02/12/23 164 lb (74.4 kg)  10/22/22 160 lb (72.6 kg)    BP Readings from Last 3 Encounters:  03/27/23 106/71  02/12/23 104/68  10/22/22 118/70     Physical Exam Vitals and nursing note reviewed.  Constitutional:      Appearance: Normal appearance. She is normal weight. She is not toxic-appearing.  HENT:     Head: Normocephalic and atraumatic.     Right Ear: Tympanic membrane, ear canal and external ear normal.     Left Ear: Tympanic membrane, ear canal and external ear normal.     Nose: Nose normal.     Mouth/Throat:     Mouth: Mucous membranes are  moist.  Eyes:     Extraocular Movements: Extraocular movements intact.     Conjunctiva/sclera: Conjunctivae normal.     Pupils: Pupils are equal, round, and reactive to light.  Cardiovascular:     Rate and Rhythm: Normal rate and regular rhythm.     Pulses: Normal pulses.     Heart sounds: Normal heart sounds.  Pulmonary:     Effort: Pulmonary effort is normal.     Breath sounds: Normal breath sounds.  Abdominal:     General: Abdomen is flat. Bowel sounds are normal.     Palpations: Abdomen is soft.  Musculoskeletal:        General: Normal range of motion.     Cervical back: Normal range of motion and neck supple.     Right lower leg: Edema present.     Left lower leg: Edema present.     Comments: Non-pitting, equal lower BLE edema  Skin:    General: Skin is warm and dry.  Neurological:     General: No focal deficit present.     Mental Status: She is alert and oriented to person, place, and time.  Psychiatric:        Mood and Affect: Mood normal.        Behavior: Behavior normal.        Thought Content: Thought content normal.        Judgment: Judgment normal.        Assessment & Plan:  Encounter for annual physical exam -     Lipid panel -     Comprehensive metabolic panel -     CBC with Differential/Platelet -     Hemoglobin A1c -     TSH  Generalized anxiety disorder -     Citalopram Hydrobromide; TAKE 1 TABLET(10 MG) BY MOUTH DAILY  Dispense: 90 tablet; Refill: 3  Edema, unspecified type -     Furosemide; Take 1 tablet (20 mg total) by mouth daily as needed.  Dispense: 30 tablet; Refill: 2  Need for influenza vaccination -     Flu vaccine trivalent PF, 6mos and older(Flulaval,Afluria,Fluarix,Fluzone)  Need for shingles vaccine -     Varicella-zoster vaccine IM   Assessment and Plan    Depression Self-discontinued Celexa due to concerns about potential interactions with other medications.  -Restart Celexa 10mg  daily.  Weight Gain and Edema Reports  weight gain and swelling in legs. No signs of varicose veins or other complications. Likely due to fluid retention. -Encourage low sodium diet, regular exercise, and leg elevation. -Prescribe Lasix 20 mg PRN for fluid retention. -Advise use of compression stockings.   General Health Maintenance -Order  fasting blood work to check cholesterol, thyroid, sugar levels, and liver function. -Administer first dose of Shingles vaccine today, with booster in six months.      Age-appropriate screening and counseling performed today. Will check labs and call with results. Preventive measures discussed and printed in AVS for patient.   Patient Counseling: [x]   Nutrition: Stressed importance of moderation in sodium/caffeine intake, saturated fat and cholesterol, caloric balance, sufficient intake of fresh fruits, vegetables, and fiber.  [x]   Stressed the importance of regular exercise.   []   Substance Abuse: Discussed cessation/primary prevention of tobacco, alcohol, or other drug use; driving or other dangerous activities under the influence; availability of treatment for abuse.   []   Injury prevention: Discussed safety belts, safety helmets, smoke detector, smoking near bedding or upholstery.   []   Sexuality: Discussed sexually transmitted diseases, partner selection, use of condoms, avoidance of unintended pregnancy  and contraceptive alternatives.   [x]   Dental health: Discussed importance of regular tooth brushing, flossing, and dental visits.  [x]   Health maintenance and immunizations reviewed. Please refer to Health maintenance section.         Return in about 6 months (around 09/24/2023) for recheck/follow-up.   Telena Peyser M Hilda Rynders, PA-C

## 2023-03-31 ENCOUNTER — Other Ambulatory Visit (INDEPENDENT_AMBULATORY_CARE_PROVIDER_SITE_OTHER): Payer: Commercial Managed Care - PPO

## 2023-03-31 ENCOUNTER — Other Ambulatory Visit: Payer: Self-pay | Admitting: Radiology

## 2023-03-31 DIAGNOSIS — Z0000 Encounter for general adult medical examination without abnormal findings: Secondary | ICD-10-CM

## 2023-03-31 LAB — CBC WITH DIFFERENTIAL/PLATELET
Basophils Absolute: 0 10*3/uL (ref 0.0–0.1)
Basophils Relative: 1.5 % (ref 0.0–3.0)
Eosinophils Absolute: 0.1 10*3/uL (ref 0.0–0.7)
Eosinophils Relative: 2.6 % (ref 0.0–5.0)
HCT: 39.6 % (ref 36.0–46.0)
Hemoglobin: 13.3 g/dL (ref 12.0–15.0)
Lymphocytes Relative: 43.4 % (ref 12.0–46.0)
Lymphs Abs: 1.2 10*3/uL (ref 0.7–4.0)
MCHC: 33.7 g/dL (ref 30.0–36.0)
MCV: 92 fL (ref 78.0–100.0)
Monocytes Absolute: 0.2 10*3/uL (ref 0.1–1.0)
Monocytes Relative: 7.9 % (ref 3.0–12.0)
Neutro Abs: 1.2 10*3/uL — ABNORMAL LOW (ref 1.4–7.7)
Neutrophils Relative %: 44.6 % (ref 43.0–77.0)
Platelets: 231 10*3/uL (ref 150.0–400.0)
RBC: 4.31 Mil/uL (ref 3.87–5.11)
RDW: 12.4 % (ref 11.5–15.5)
WBC: 2.8 10*3/uL — ABNORMAL LOW (ref 4.0–10.5)

## 2023-03-31 LAB — TSH: TSH: 1.84 u[IU]/mL (ref 0.35–5.50)

## 2023-03-31 LAB — COMPREHENSIVE METABOLIC PANEL
ALT: 11 U/L (ref 0–35)
AST: 12 U/L (ref 0–37)
Albumin: 4 g/dL (ref 3.5–5.2)
Alkaline Phosphatase: 35 U/L — ABNORMAL LOW (ref 39–117)
BUN: 11 mg/dL (ref 6–23)
CO2: 29 meq/L (ref 19–32)
Calcium: 9.1 mg/dL (ref 8.4–10.5)
Chloride: 104 meq/L (ref 96–112)
Creatinine, Ser: 0.81 mg/dL (ref 0.40–1.20)
GFR: 84.34 mL/min (ref >60.00)
Glucose, Bld: 97 mg/dL (ref 70–99)
Potassium: 4.8 meq/L (ref 3.5–5.1)
Sodium: 139 meq/L (ref 135–145)
Total Bilirubin: 0.8 mg/dL (ref 0.2–1.2)
Total Protein: 6.2 g/dL (ref 6.0–8.3)

## 2023-03-31 LAB — LIPID PANEL
Cholesterol: 192 mg/dL (ref 0–200)
HDL: 54.9 mg/dL (ref >39.00)
LDL Cholesterol: 127 mg/dL — ABNORMAL HIGH (ref 0–99)
NonHDL: 136.87
Total CHOL/HDL Ratio: 3
Triglycerides: 50 mg/dL (ref 0.0–149.0)
VLDL: 10 mg/dL (ref 0.0–40.0)

## 2023-03-31 LAB — HEMOGLOBIN A1C: Hgb A1c MFr Bld: 5.6 % (ref 4.6–6.5)

## 2023-03-31 NOTE — Addendum Note (Signed)
Addended by: Lorn Junes on: 03/31/2023 08:52 AM   Modules accepted: Orders

## 2023-04-02 ENCOUNTER — Other Ambulatory Visit: Payer: Self-pay

## 2023-04-02 ENCOUNTER — Encounter: Payer: Self-pay | Admitting: Physician Assistant

## 2023-04-02 DIAGNOSIS — Z1388 Encounter for screening for disorder due to exposure to contaminants: Secondary | ICD-10-CM

## 2023-04-02 DIAGNOSIS — D729 Disorder of white blood cells, unspecified: Secondary | ICD-10-CM

## 2023-04-03 ENCOUNTER — Other Ambulatory Visit (INDEPENDENT_AMBULATORY_CARE_PROVIDER_SITE_OTHER): Payer: Commercial Managed Care - PPO

## 2023-04-03 DIAGNOSIS — Z1388 Encounter for screening for disorder due to exposure to contaminants: Secondary | ICD-10-CM

## 2023-04-03 DIAGNOSIS — D729 Disorder of white blood cells, unspecified: Secondary | ICD-10-CM

## 2023-04-03 LAB — CBC WITH DIFFERENTIAL/PLATELET
Basophils Absolute: 0 10*3/uL (ref 0.0–0.1)
Basophils Relative: 0.7 % (ref 0.0–3.0)
Eosinophils Absolute: 0.1 10*3/uL (ref 0.0–0.7)
Eosinophils Relative: 1 % (ref 0.0–5.0)
HCT: 42.3 % (ref 36.0–46.0)
Hemoglobin: 14.1 g/dL (ref 12.0–15.0)
Lymphocytes Relative: 36.8 % (ref 12.0–46.0)
Lymphs Abs: 2.3 10*3/uL (ref 0.7–4.0)
MCHC: 33.3 g/dL (ref 30.0–36.0)
MCV: 91.5 fL (ref 78.0–100.0)
Monocytes Absolute: 0.4 10*3/uL (ref 0.1–1.0)
Monocytes Relative: 5.6 % (ref 3.0–12.0)
Neutro Abs: 3.5 10*3/uL (ref 1.4–7.7)
Neutrophils Relative %: 55.9 % (ref 43.0–77.0)
Platelets: 290 10*3/uL (ref 150.0–400.0)
RBC: 4.62 Mil/uL (ref 3.87–5.11)
RDW: 12.4 % (ref 11.5–15.5)
WBC: 6.3 10*3/uL (ref 4.0–10.5)

## 2023-04-06 LAB — LEAD, BLOOD (ADULT >= 16 YRS): Lead: <1 ug/dL (ref <3.5)

## 2023-05-04 ENCOUNTER — Other Ambulatory Visit: Payer: Commercial Managed Care - PPO

## 2023-05-19 ENCOUNTER — Encounter: Payer: Self-pay | Admitting: Obstetrics and Gynecology

## 2023-07-23 ENCOUNTER — Encounter: Payer: Self-pay | Admitting: Physician Assistant

## 2023-07-23 NOTE — Telephone Encounter (Signed)
 Called pt and schedule appt to discuss these issues and new diagnosis. Pt will discuss with PCP if May appt is still necessary at this visit

## 2023-08-06 ENCOUNTER — Ambulatory Visit (INDEPENDENT_AMBULATORY_CARE_PROVIDER_SITE_OTHER): Admitting: Physician Assistant

## 2023-08-06 VITALS — BP 102/64 | HR 55 | Temp 98.3°F | Ht 64.0 in | Wt 162.4 lb

## 2023-08-06 DIAGNOSIS — H6993 Unspecified Eustachian tube disorder, bilateral: Secondary | ICD-10-CM

## 2023-08-06 DIAGNOSIS — R519 Headache, unspecified: Secondary | ICD-10-CM | POA: Diagnosis not present

## 2023-08-06 DIAGNOSIS — R42 Dizziness and giddiness: Secondary | ICD-10-CM

## 2023-08-06 DIAGNOSIS — R253 Fasciculation: Secondary | ICD-10-CM

## 2023-08-06 MED ORDER — MECLIZINE HCL 25 MG PO TABS: 25.0000 mg | ORAL_TABLET | Freq: Three times a day (TID) | ORAL | 0 refills | Status: DC | PRN

## 2023-08-06 NOTE — Progress Notes (Unsigned)
    Patient ID: Sue Lopez, female    DOB: [birth year], 53 y.o.   MRN: [id number]   Assessment & Plan:  There are no diagnoses linked to this encounter.    Assessment and Plan Assessment & Plan       No follow-ups on file.    Subjective:    Chief Complaint  Patient presents with   Dizziness    Pt in office for Vertigo and other symptoms she is currently experiencing; pt said last year was really bad but now not as bad as before. Pt has been having piercing headaches, eyelid flutter; pt is fasting if labs are needed.     HPI Discussed the use of AI scribe software for clinical note transcription with the patient, who gave verbal consent to proceed.  History of Present Illness      Past Medical History:  Diagnosis Date   ACL (anterior cruciate ligament) tear 10/18/1986   Chickenpox    Elevated bilirubin 07/10/2017   Encounter for examination of ears and hearing after failed hearing screening 07/03/2022   Family Hx of alcoholism, both parents 06/05/2018   Genital warts    Hyperbilirubinemia 06/05/2018   Irregular menses 06/05/2018   Vitamin D deficiency 07/10/2017    Past Surgical History:  Procedure Laterality Date   ANTERIOR CRUCIATE LIGAMENT REPAIR Right 1989   BREAST SURGERY  04/18/2017   VARICOSE VEIN SURGERY Right     Family History  Problem Relation Age of Onset   Heart Problems Mother    Alcohol abuse Mother    COPD Mother    Hypertension Mother    Alcohol abuse Father    Diabetes Father    Heart Problems Father    Stroke Father    Alcohol abuse Maternal Grandfather    Hypertension Maternal Grandfather    Lung cancer Maternal Grandfather    Heart disease Paternal Grandmother    Hyperlipidemia Paternal Grandmother    Heart disease Paternal Grandfather    Hyperlipidemia Paternal Grandfather     Social History   Tobacco Use   Smoking status: Never   Smokeless tobacco: Former    Types: Chew    Quit date: 06/04/1992  Vaping Use    Vaping status: Never Used  Substance Use Topics   Alcohol use: Yes    Comment: occasion glass of wine   Drug use: No     No Known Allergies  Review of Systems NEGATIVE UNLESS OTHERWISE INDICATED IN HPI      Objective:     BP 102/64 (BP Location: Left Arm, Patient Position: Sitting, Cuff Size: Normal)   Pulse (!) 55   Temp 98.3 F (36.8 C) (Temporal)   Ht 5\' 4"  (1.626 m)   Wt 162 lb 6.4 oz (73.7 kg)   SpO2 99%   BMI 27.88 kg/m   Wt Readings from Last 3 Encounters:  08/06/23 162 lb 6.4 oz (73.7 kg)  03/27/23 167 lb 6.4 oz (75.9 kg)  02/12/23 164 lb (74.4 kg)    BP Readings from Last 3 Encounters:  08/06/23 102/64  03/27/23 106/71  02/12/23 104/68     Physical Exam          Ganon Demasi M Bryanda Mikel, PA-C

## 2023-08-17 ENCOUNTER — Other Ambulatory Visit

## 2023-09-14 ENCOUNTER — Encounter: Payer: Self-pay | Admitting: Physician Assistant

## 2023-09-14 NOTE — Telephone Encounter (Signed)
 Please see patient message in regards to MRI being cancelled and advise on insurance prior auth to complete imaging

## 2023-09-15 ENCOUNTER — Telehealth: Payer: Self-pay

## 2023-09-15 NOTE — Telephone Encounter (Signed)
 Copied from CRM [id number]. Topic: General - Other >> Sep 15, 2023 10:31 AM Marlan Silva wrote: Reason for CRM: patient called in wanting to give her insurance information to the referral coordinator. Patients insurance is on file and has not changed. Insurance is and is reflected in the chart.    [employer] [[id number]] Sue Lopez ([id number]) Effective since 05/20/2019 Group Number: [id number]  Sent information to [facility] for approval of MRI ordered previously, also sending information to front office staff to correct patient insurance in chart. Please update insurance on file for patient to show correct information.

## 2023-09-24 ENCOUNTER — Ambulatory Visit: Payer: Commercial Managed Care - PPO | Admitting: Physician Assistant

## 2023-10-21 ENCOUNTER — Ambulatory Visit: Admitting: Podiatry

## 2023-11-03 ENCOUNTER — Ambulatory Visit (INDEPENDENT_AMBULATORY_CARE_PROVIDER_SITE_OTHER): Admitting: Podiatry

## 2023-11-03 ENCOUNTER — Encounter: Payer: Self-pay | Admitting: Podiatry

## 2023-11-03 DIAGNOSIS — G5761 Lesion of plantar nerve, right lower limb: Secondary | ICD-10-CM | POA: Diagnosis not present

## 2023-11-03 MED ORDER — TRIAMCINOLONE ACETONIDE 10 MG/ML IJ SUSP
2.5000 mg | Freq: Once | INTRAMUSCULAR | Status: AC
  Administered 2023-11-03: 2.5 mg via INTRA_ARTICULAR

## 2023-11-03 MED ORDER — DEXAMETHASONE SODIUM PHOSPHATE 120 MG/30ML IJ SOLN
4.0000 mg | Freq: Once | INTRAMUSCULAR | Status: AC
  Administered 2023-11-03: 4 mg via INTRA_ARTICULAR

## 2023-11-03 NOTE — Progress Notes (Signed)
  Subjective:  Patient ID: Sharren Schnurr, female    DOB: [birth year],   MRN: [id number]  Chief Complaint  Patient presents with   Injections    Patient states that she is interested in getting the shot again due to pain in her right foot.    53 y.o. female presents for follow-up of right foot pain . Relates she was doing well for a while after her shot but now th epain is returning and hoping for another shot.  Denies any other pedal complaints. Denies n/v/f/c.   Past Medical History:  Diagnosis Date   ACL (anterior cruciate ligament) tear 10/18/1986   Chickenpox    Elevated bilirubin 07/10/2017   Encounter for examination of ears and hearing after failed hearing screening 07/03/2022   Family Hx of alcoholism, both parents 06/05/2018   Genital warts    Hyperbilirubinemia 06/05/2018   Irregular menses 06/05/2018   Vitamin D  deficiency 07/10/2017    Objective:  Physical Exam: Vascular: DP/PT pulses 2/4 bilateral. CFT <3 seconds. Normal hair growth on digits. No edema.  Skin. No lacerations or abrasions bilateral feet.  Musculoskeletal: MMT 5/5 bilateral lower extremities in DF, PF, Inversion and Eversion. Deceased ROM in DF of ankle joint. Tender to third interspace upon palpation some pain over fourth and fifth MPJ. Pain with metatarsal squeeze. Positive mulders click. More pain noted over the third and fourth metatarsal today with ecchymosis noted over the dorsum of the foot.  Neurological: Sensation intact to light touch. \   MRI right foot  IMPRESSION: 1. Bone marrow edema in the lateral hallux sesamoid as can be seen with sesamoiditis. 2. Small amount of fluid in the first, second and third intermetatarsal bursae as can be seen with intermetatarsal bursitis.   Assessment:   1. Morton neuroma, right          Plan:  Patient was evaluated and treated and all questions answered. Discussed neuroma and treatment options with patient.  Radiographs reviewed and discussed  with patient. No acute fractures or dislocations. Some periosteal reaction noted around third and fourth metatarsals.  -Discussed treatement options for stress fracture and nueroma; risks, alternatives, and benefits explained. -Injection offered today procedure below.  -Discussed surgical options in the future if needed.  -Rx pain med/antinflammatories as needed -Patient to return to office as needed.   Procedure: Injection Tendon/Ligament Discussed alternatives, risks, complications and verbal consent was obtained.  Location: Right third interspace. Skin Prep: Alcohol. Injectate: 1cc 0.5% marcaine plain, 1 cc dexamethasone  0.5 cc kenalog   Disposition: Patient tolerated procedure well. Injection site dressed with a band-aid.  Post-injection care was discussed and return precautions discussed.        Jennefer Moats, DPM

## 2023-11-23 ENCOUNTER — Ambulatory Visit (INDEPENDENT_AMBULATORY_CARE_PROVIDER_SITE_OTHER): Payer: Self-pay | Admitting: Physician Assistant

## 2023-11-23 ENCOUNTER — Encounter: Payer: Self-pay | Admitting: Physician Assistant

## 2023-11-23 ENCOUNTER — Ambulatory Visit: Payer: Self-pay | Admitting: Physician Assistant

## 2023-11-23 VITALS — BP 98/72 | HR 69 | Temp 98.1°F | Ht 64.0 in | Wt 161.8 lb

## 2023-11-23 DIAGNOSIS — I8393 Asymptomatic varicose veins of bilateral lower extremities: Secondary | ICD-10-CM

## 2023-11-23 DIAGNOSIS — R600 Localized edema: Secondary | ICD-10-CM

## 2023-11-23 DIAGNOSIS — R519 Headache, unspecified: Secondary | ICD-10-CM

## 2023-11-23 DIAGNOSIS — N951 Menopausal and female climacteric states: Secondary | ICD-10-CM | POA: Diagnosis not present

## 2023-11-23 LAB — COMPREHENSIVE METABOLIC PANEL WITH GFR
ALT: 15 U/L (ref 0–35)
AST: 20 U/L (ref 0–37)
Albumin: 4.5 g/dL (ref 3.5–5.2)
Alkaline Phosphatase: 33 U/L — ABNORMAL LOW (ref 39–117)
BUN: 11 mg/dL (ref 6–23)
CO2: 31 meq/L (ref 19–32)
Calcium: 9.4 mg/dL (ref 8.4–10.5)
Chloride: 102 meq/L (ref 96–112)
Creatinine, Ser: 0.9 mg/dL (ref 0.40–1.20)
GFR: 73.99 mL/min (ref >60.00)
Glucose, Bld: 105 mg/dL — ABNORMAL HIGH (ref 70–99)
Potassium: 4.6 meq/L (ref 3.5–5.1)
Sodium: 141 meq/L (ref 135–145)
Total Bilirubin: 1 mg/dL (ref 0.2–1.2)
Total Protein: 6.7 g/dL (ref 6.0–8.3)

## 2023-11-23 LAB — URINALYSIS, ROUTINE W REFLEX MICROSCOPIC
Bilirubin Urine: NEGATIVE
Hgb urine dipstick: NEGATIVE
Ketones, ur: NEGATIVE
Leukocytes,Ua: NEGATIVE
Nitrite: NEGATIVE
RBC / HPF: NONE SEEN (ref 0–?)
Specific Gravity, Urine: <=1.005 — AB (ref 1.000–1.030)
Total Protein, Urine: NEGATIVE
Urine Glucose: NEGATIVE
Urobilinogen, UA: 0.2 (ref 0.0–1.0)
WBC, UA: NONE SEEN (ref 0–?)
pH: 6.5 (ref 5.0–8.0)

## 2023-11-23 LAB — CBC WITH DIFFERENTIAL/PLATELET
Basophils Absolute: 0 K/uL (ref 0.0–0.1)
Basophils Relative: 0.9 % (ref 0.0–3.0)
Eosinophils Absolute: 0.1 K/uL (ref 0.0–0.7)
Eosinophils Relative: 1.5 % (ref 0.0–5.0)
HCT: 42.5 % (ref 36.0–46.0)
Hemoglobin: 14.5 g/dL (ref 12.0–15.0)
Lymphocytes Relative: 31.5 % (ref 12.0–46.0)
Lymphs Abs: 1.4 K/uL (ref 0.7–4.0)
MCHC: 34.1 g/dL (ref 30.0–36.0)
MCV: 91.4 fl (ref 78.0–100.0)
Monocytes Absolute: 0.3 K/uL (ref 0.1–1.0)
Monocytes Relative: 6.7 % (ref 3.0–12.0)
Neutro Abs: 2.7 K/uL (ref 1.4–7.7)
Neutrophils Relative %: 59.4 % (ref 43.0–77.0)
Platelets: 239 K/uL (ref 150.0–400.0)
RBC: 4.65 Mil/uL (ref 3.87–5.11)
RDW: 12.7 % (ref 11.5–15.5)
WBC: 4.5 K/uL (ref 4.0–10.5)

## 2023-11-23 LAB — FSH/LH
FSH: 9.5 m[IU]/mL
LH: 7.4 m[IU]/mL

## 2023-11-23 LAB — ESTRADIOL: Estradiol: 212 pg/mL

## 2023-11-23 NOTE — Progress Notes (Signed)
 Patient ID: Sue Lopez, female    DOB: Nov 11, 1970, 53 y.o.   MRN: [id number]   Assessment & Plan:  Bilateral leg edema -     CBC with Differential/Platelet -     Comprehensive metabolic panel with GFR -     Urinalysis, Routine w reflex microscopic -     Ambulatory referral to Vascular Surgery  Menopausal symptoms -     FSH/LH -     Estradiol   Left-sided headache  Varicose veins of both lower extremities, unspecified whether complicated -     Ambulatory referral to Vascular Surgery      Assessment and Plan Assessment & Plan Bilateral lower extremity edema Chronic bilateral lower extremity edema, more pronounced in the right leg, likely due to previous varicose vein ablation and knee surgery. Differential diagnosis includes hormonal changes, congestive heart failure, renal or hepatic issues. No symptoms of heart failure such as dyspnea or chest pain. Previous ultrasounds in 2018 and 2020 were unremarkable. Blood work and urinalysis are planned to exclude renal or hepatic causes. Discussed benefits of compression stockings and lymphatic massage. - Order CBC, glucose, liver and renal function tests - Order urinalysis for proteinuria - Refer to vein and vascular specialist for bilateral lower extremity ultrasound - Advise use of compression stockings and consider lymphatic massage  Headache Intermittent headaches with a recent severe episode. MRI was previously ordered but not completed due to insurance issues. No new neurological symptoms. Discussed resolving insurance issues to complete MRI for further evaluation. - Resolve insurance issues for MRI scheduling - Check with referral specialists regarding MRI referral status  Perimenopausal symptoms Symptoms suggestive of perimenopause, including irregular menstrual cycles and a single hot flash. Hormonal changes suspected. Baseline hormone levels desired for future comparison. Discussed checking FSH and estradiol  levels. - Order  FSH and estradiol  levels      No follow-ups on file.    Subjective:    Chief Complaint  Patient presents with   Leg Swelling    Pt seen today for leg swelling; currently taking lasix  1x day; would like to rule out liver issues;    HPI Discussed the use of AI scribe software for clinical note transcription with the patient, who gave verbal consent to proceed.  History of Present Illness Sue Lopez is a 53 year old female who presents with bilateral leg swelling and concerns about hormone levels.  She experiences bilateral leg swelling, with the right leg more affected than the left. The swelling worsened significantly a few days ago, prompting her to seek medical attention. The swelling is sometimes painful and sore, particularly a couple of weeks ago when she scheduled the visit. She has a history of varicose vein treatment in the right leg, which involved vein ablation. She has not made significant dietary changes but is trying to reduce salt intake. She intermittently uses Lasix  but stopped due to concerns about interactions with vitamin D , which she also takes. She has not been taking vitamin D  recently. Elevating her legs during sleep provides temporary relief, but she finds it difficult due to her preference for stomach sleeping. She occasionally wears compression stockings, especially during flights, but finds them uncomfortable for regular use.  She is concerned about her hormone levels and requests a baseline check to monitor changes over time. Her concerns are related to her wife's experiences. She experienced a single hot flash while flying, which was alarming, but has not had another since. Her menstrual cycles are becoming irregular, with some periods  being very heavy and others more normal.  She expresses concern about potential serious underlying conditions, such as heart, kidney, or liver issues, and mentions a family history of brain cancer in her sister-in-law's family.  She recalls a past liver scan that showed spots, which were deemed non-concerning at the time. She is worried about missing early signs of serious conditions and wants to ensure her symptoms are not indicative of a more serious issue.  She reports occasional dizziness, which she has become accustomed to, and a recent severe headache that she wants to investigate further. She has had issues with her insurance, which has delayed scheduling an MRI for her head.     Past Medical History:  Diagnosis Date   ACL (anterior cruciate ligament) tear 10/18/1986   Chickenpox    Elevated bilirubin 07/10/2017   Encounter for examination of ears and hearing after failed hearing screening 07/03/2022   Family Hx of alcoholism, both parents 06/05/2018   Genital warts    Hyperbilirubinemia 06/05/2018   Irregular menses 06/05/2018   Vitamin D  deficiency 07/10/2017    Past Surgical History:  Procedure Laterality Date   ANTERIOR CRUCIATE LIGAMENT REPAIR Right 1989   BREAST SURGERY  04/18/2017   VARICOSE VEIN SURGERY Right     Family History  Problem Relation Age of Onset   Heart Problems Mother    Alcohol abuse Mother    COPD Mother    Hypertension Mother    Alcohol abuse Father    Diabetes Father    Heart Problems Father    Stroke Father    Alcohol abuse Maternal Grandfather    Hypertension Maternal Grandfather    Lung cancer Maternal Grandfather    Heart disease Paternal Grandmother    Hyperlipidemia Paternal Grandmother    Heart disease Paternal Grandfather    Hyperlipidemia Paternal Grandfather     Social History   Tobacco Use   Smoking status: Never   Smokeless tobacco: Former    Types: Chew    Quit date: 06/04/1992  Vaping Use   Vaping status: Never Used  Substance Use Topics   Alcohol use: Yes    Comment: occasion glass of wine   Drug use: No     No Known Allergies  Review of Systems NEGATIVE UNLESS OTHERWISE INDICATED IN HPI      Objective:     BP 98/72 (BP  Location: Right Arm, Patient Position: Sitting, Cuff Size: Normal)   Pulse 69   Temp 98.1 F (36.7 C) (Temporal)   Ht 5' 4 (1.626 m)   Wt 161 lb 12.8 oz (73.4 kg)   LMP 11/15/2023   SpO2 98%   BMI 27.77 kg/m   Wt Readings from Last 3 Encounters:  11/23/23 161 lb 12.8 oz (73.4 kg)  08/06/23 162 lb 6.4 oz (73.7 kg)  03/27/23 167 lb 6.4 oz (75.9 kg)    BP Readings from Last 3 Encounters:  11/23/23 98/72  08/06/23 102/64  03/27/23 106/71     Physical Exam Vitals and nursing note reviewed.  Constitutional:      Appearance: Normal appearance. She is normal weight. She is not toxic-appearing.  HENT:     Head: Normocephalic and atraumatic.     Right Ear: Tympanic membrane, ear canal and external ear normal.     Left Ear: Tympanic membrane, ear canal and external ear normal.     Nose: Nose normal.     Mouth/Throat:     Mouth: Mucous membranes are moist.  Eyes:  Extraocular Movements: Extraocular movements intact.     Conjunctiva/sclera: Conjunctivae normal.     Pupils: Pupils are equal, round, and reactive to light.  Cardiovascular:     Rate and Rhythm: Normal rate and regular rhythm.     Pulses: Normal pulses.     Heart sounds: Normal heart sounds.  Pulmonary:     Effort: Pulmonary effort is normal.     Breath sounds: Normal breath sounds.  Abdominal:     General: Abdomen is flat. Bowel sounds are normal.     Palpations: Abdomen is soft.  Musculoskeletal:        General: Normal range of motion.     Cervical back: Normal range of motion and neck supple.     Right lower leg: Edema present.     Left lower leg: Edema present.     Comments: Non-pitting, equal lower BLE edema  Skin:    General: Skin is warm and dry.  Neurological:     General: No focal deficit present.     Mental Status: She is alert and oriented to person, place, and time.  Psychiatric:        Mood and Affect: Mood normal.        Behavior: Behavior normal.        Thought Content: Thought content  normal.        Judgment: Judgment normal.             Cyan Clippinger M Warnie Belair, PA-C

## 2023-12-11 ENCOUNTER — Encounter: Payer: Self-pay | Admitting: Physician Assistant

## 2024-01-19 ENCOUNTER — Other Ambulatory Visit: Payer: Self-pay

## 2024-01-19 DIAGNOSIS — R600 Localized edema: Secondary | ICD-10-CM

## 2024-02-11 ENCOUNTER — Encounter: Payer: Self-pay | Admitting: *Deleted

## 2024-02-15 ENCOUNTER — Ambulatory Visit: Payer: Commercial Managed Care - PPO | Admitting: Obstetrics and Gynecology

## 2024-02-16 ENCOUNTER — Ambulatory Visit: Admitting: Obstetrics and Gynecology

## 2024-02-16 ENCOUNTER — Encounter: Payer: Self-pay | Admitting: Obstetrics and Gynecology

## 2024-02-16 ENCOUNTER — Other Ambulatory Visit (HOSPITAL_COMMUNITY)
Admission: RE | Admit: 2024-02-16 | Discharge: 2024-02-16 | Disposition: A | Discharge status: Home / Self Care | Source: Ambulatory Visit | Attending: Obstetrics and Gynecology | Admitting: Obstetrics and Gynecology

## 2024-02-16 VITALS — BP 102/64 | HR 103 | Ht 63.78 in | Wt 161.2 lb

## 2024-02-16 DIAGNOSIS — Z01419 Encounter for gynecological examination (general) (routine) without abnormal findings: Secondary | ICD-10-CM | POA: Diagnosis not present

## 2024-02-16 DIAGNOSIS — Z1211 Encounter for screening for malignant neoplasm of colon: Secondary | ICD-10-CM

## 2024-02-16 DIAGNOSIS — E2839 Other primary ovarian failure: Secondary | ICD-10-CM | POA: Diagnosis not present

## 2024-02-16 DIAGNOSIS — Z1331 Encounter for screening for depression: Secondary | ICD-10-CM

## 2024-02-16 DIAGNOSIS — E049 Nontoxic goiter, unspecified: Secondary | ICD-10-CM

## 2024-02-16 DIAGNOSIS — Z8619 Personal history of other infectious and parasitic diseases: Secondary | ICD-10-CM

## 2024-02-16 DIAGNOSIS — Z23 Encounter for immunization: Secondary | ICD-10-CM | POA: Diagnosis not present

## 2024-02-16 NOTE — Progress Notes (Signed)
 53 y.o. y.o. female here for annual exam. Patient's last menstrual period was 02/10/2024 (exact date). Period Cycle (Days): 28 Period Duration (Days): 4 Period Pattern: Regular Menstrual Flow: Moderate Menstrual Control: Thin pad, Tampon Dysmenorrhea: (!) Moderate Dysmenorrhea Symptoms: Headache  Has periods q month [facility] 7/25 (9)  Last mammogram: 12/25 at [facility] to get all records Last colonoscopy: 2023 cologuard referral placed for 2026 repeat Dxa: referral to [facility] for baseline. 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 Would like annual with HPV testing Gardesil: none Working with PCP on b/l edema. Denies any CP, SOB or palpitations Has noticed some hot flashes recently. No current HRT Body mass index is 27.86 kg/m.     02/16/2024    9:39 AM 11/23/2023    8:19 AM 03/27/2023    9:33 AM  Depression screen PHQ 2/9  Decreased Interest 0 0 0  Down, Depressed, Hopeless 0 0 0  PHQ - 2 Score 0 0 0  Altered sleeping  1 0  Tired, decreased energy  1 1  Change in appetite  0 0  Feeling bad or failure about yourself   0 0  Trouble concentrating  0 0  Moving slowly or fidgety/restless  0 0  Suicidal thoughts  0 0  PHQ-9 Score  2 1  Difficult doing work/chores  Not difficult at all Not difficult at all    Blood pressure 102/64, pulse (!) 103, height 5' 3.78 (1.62 m), weight 161 lb 3.2 oz (73.1 kg), last menstrual period 02/10/2024, SpO2 98%.     Component Value Date/Time   DIAGPAP  02/12/2023 1144    - Negative for intraepithelial lesion or malignancy (NILM)   DIAGPAP  11/13/2021 0831    - Negative for intraepithelial lesion or malignancy (NILM)   DIAGPAP  11/08/2020 0835    - Negative for intraepithelial lesion or malignancy (NILM)   HPVHIGH Negative 02/12/2023 1144   HPVHIGH Positive (A) 11/13/2021 0831   HPVHIGH Positive (A) 11/08/2020 0835   ADEQPAP  02/12/2023 1144    Satisfactory for evaluation; transformation zone component ABSENT.   ADEQPAP   11/13/2021 0831    Satisfactory for evaluation; transformation zone component PRESENT.   ADEQPAP  11/08/2020 0835    Satisfactory for evaluation; transformation zone component PRESENT.    GYN HISTORY:    Component Value Date/Time   DIAGPAP  02/12/2023 1144    - Negative for intraepithelial lesion or malignancy (NILM)   DIAGPAP  11/13/2021 0831    - Negative for intraepithelial lesion or malignancy (NILM)   DIAGPAP  11/08/2020 0835    - Negative for intraepithelial lesion or malignancy (NILM)   HPVHIGH Negative 02/12/2023 1144   HPVHIGH Positive (A) 11/13/2021 0831   HPVHIGH Positive (A) 11/08/2020 0835   ADEQPAP  02/12/2023 1144    Satisfactory for evaluation; transformation zone component ABSENT.   ADEQPAP  11/13/2021 0831    Satisfactory for evaluation; transformation zone component PRESENT.   ADEQPAP  11/08/2020 0835    Satisfactory for evaluation; transformation zone component PRESENT.    OB History  Gravida Para Term Preterm AB Living  0 0 0 0 0 0  SAB IAB Ectopic Multiple Live Births  0 0 0 0 0    Past Medical History:  Diagnosis Date   ACL (anterior cruciate ligament) tear 10/18/1986   Chickenpox    Elevated bilirubin 07/10/2017   Encounter for examination of ears and hearing after failed hearing screening 07/03/2022   Family Hx of  alcoholism, both parents 06/05/2018   Genital warts    Hyperbilirubinemia 06/05/2018   Irregular menses 06/05/2018   Vitamin D  deficiency 07/10/2017    Past Surgical History:  Procedure Laterality Date   ANTERIOR CRUCIATE LIGAMENT REPAIR Right 1989   BREAST SURGERY  04/18/2017   VARICOSE VEIN SURGERY Right     Current Outpatient Medications on File Prior to Visit  Medication Sig Dispense Refill   acetaminophen (TYLENOL) 325 MG tablet Take 650 mg by mouth as needed.     citalopram  (CELEXA ) 10 MG tablet TAKE 1 TABLET(10 MG) BY MOUTH DAILY 90 tablet 3   IBUPROFEN PO Take by mouth. Prn     ketoconazole (NIZORAL) 2 % shampoo  SMARTSIG:Topical 2-3 Times Weekly PRN     Multiple Vitamins-Minerals (MULTIVITAMIN WOMEN 50+ PO) Take by mouth.     cholecalciferol (VITAMIN D3) 25 MCG (1000 UNIT) tablet Take 1,000 Units by mouth. (Patient not taking: Reported on 02/16/2024)     furosemide  (LASIX ) 20 MG tablet Take 1 tablet (20 mg total) by mouth daily as needed. (Patient not taking: Reported on 02/16/2024) 30 tablet 2   meclizine  (ANTIVERT ) 25 MG tablet Take 1 tablet (25 mg total) by mouth 3 (three) times daily as needed for dizziness. (Patient not taking: Reported on 02/16/2024) 30 tablet 0   No current facility-administered medications on file prior to visit.    Social History   Socioeconomic History   Marital status: Married    Spouse name: Delon   Number of children: Not on file   Years of education: Not on file   Highest education level: Bachelor's degree (e.g., BA, AB, BS)  Occupational History   Occupation: Marine scientist    Employer: [employer]   Tobacco Use   Smoking status: Never    Passive exposure: Past   Smokeless tobacco: Former    Types: Chew    Quit date: 06/04/1992  Vaping Use   Vaping status: Never Used  Substance and Sexual Activity   Alcohol use: Not Currently    Comment: occasion glass of wine   Drug use: No   Sexual activity: Yes    Partners: Female    Birth control/protection: None    Comment: female partner  Other Topics Concern   Not on file  Social History Narrative   Active, but no regular exercise. Eats well, mostly Paleo. Likes to drink 1-2 glasses of wine or beer several days per week, but is mindful to watch this because of strong family history of alcoholism. No children, though she and her wife tried a few years ago. She is happy with current life without children, but admits that her wife struggles with it. 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 Admits that she hates doctors due to some poor experiences in the past. It is important to her to  feel heard. Prefers not to take medications if possible, but open if needed. Okay for all preventive maintenance. Mother and father divorced when she was young. Father raised her, non-optimal environment, emotionally absent. She has a difficult time connecting with others, especially physically, but feels that she is an extrovert in that she recharges via time with friends.    Social Drivers of Corporate investment banker Strain: Low Risk  (08/06/2023)   Overall Financial Resource Strain (CARDIA)    Difficulty of Paying Living Expenses: Not hard at all  Food Insecurity: No Food Insecurity (08/06/2023)   Hunger Vital Sign    Worried About  Running Out of Food in the Last Year: Never true    Ran Out of Food in the Last Year: Never true  Transportation Needs: No Transportation Needs (08/06/2023)   PRAPARE - Administrator, Civil Service (Medical): No    Lack of Transportation (Non-Medical): No  Physical Activity: Insufficiently Active (08/06/2023)   Exercise Vital Sign    Days of Exercise per Week: 1 day    Minutes of Exercise per Session: 10 min  Stress: No Stress Concern Present (08/06/2023)   [employer] of Occupational Health - Occupational Stress Questionnaire    Feeling of Stress : Not at all  Social Connections: Moderately Isolated (08/06/2023)   Social Connection and Isolation Panel    Frequency of Communication with Friends and Family: Once a week    Frequency of Social Gatherings with Friends and Family: Twice a week    Attends Religious Services: Never    Diplomatic Services operational officer: No    Attends Engineer, structural: Not on file    Marital Status: Married  Catering manager Violence: Not on file    Family History  Problem Relation Age of Onset   Heart Problems Mother    Alcohol abuse Mother    COPD Mother    Hypertension Mother    Alcohol abuse Father    Diabetes Father    Heart Problems Father    Stroke Father    Alcohol abuse  Maternal Grandfather    Hypertension Maternal Grandfather    Lung cancer Maternal Grandfather    Heart disease Paternal Grandmother    Hyperlipidemia Paternal Grandmother    Heart disease Paternal Grandfather    Hyperlipidemia Paternal Grandfather      No Known Allergies    Patient's last menstrual period was Patient's last menstrual period was 02/10/2024 (exact date)..            Review of Systems Alls systems reviewed and are negative.     Physical Exam Constitutional:      Appearance: Normal appearance.  Genitourinary:     Vulva and urethral meatus normal.     No lesions in the vagina.     Right Labia: No rash, lesions or skin changes.    Left Labia: No lesions, skin changes or rash.    No vaginal discharge or tenderness.     No vaginal prolapse present.    No vaginal atrophy present.     Right Adnexa: not tender, not palpable and no mass present.    Left Adnexa: not tender, not palpable and no mass present.    No cervical motion tenderness or discharge.     Uterus is not enlarged, tender or irregular.  Breasts:    Right: Normal.     Left: Normal.  HENT:     Head: Normocephalic.   Neck:     Thyroid : No thyroid  mass, thyromegaly or thyroid  tenderness.  Cardiovascular:     Rate and Rhythm: Normal rate and regular rhythm.     Heart sounds: Normal heart sounds, S1 normal and S2 normal.  Pulmonary:     Effort: Pulmonary effort is normal.     Breath sounds: Normal breath sounds and air entry.  Abdominal:     General: There is no distension.     Palpations: Abdomen is soft. There is no mass.     Tenderness: There is no abdominal tenderness. There is no guarding or rebound.  Musculoskeletal:        General:  Normal range of motion.     Cervical back: Full passive range of motion without pain, normal range of motion and neck supple. No tenderness.     Right lower leg: No edema.     Left lower leg: No edema.  Neurological:     Mental Status: She is alert.   Skin:    General: Skin is warm.  Psychiatric:        Mood and Affect: Mood normal.        Behavior: Behavior normal.        Thought Content: Thought content normal.  Vitals and nursing note reviewed. Exam conducted with a chaperone present.       A:         Well Woman GYN exam H/o HPV DNA                             P:        Pap smear collected today Encouraged annual mammogram screening Colon cancer screening referral placed today DXA ordered today Labs and immunizations to do with PMD. Flu was given today Encouraged healthy lifestyle practices Encouraged Vit D and Calcium   No follow-ups on file.  Almarie MARLA Carpen

## 2024-02-18 ENCOUNTER — Ambulatory Visit (HOSPITAL_COMMUNITY)
Admission: RE | Admit: 2024-02-18 | Discharge: 2024-02-18 | Disposition: A | Discharge status: Home / Self Care | Source: Ambulatory Visit | Attending: Vascular Surgery | Admitting: Vascular Surgery

## 2024-02-18 ENCOUNTER — Ambulatory Visit (INDEPENDENT_AMBULATORY_CARE_PROVIDER_SITE_OTHER): Admitting: Vascular Surgery

## 2024-02-18 VITALS — BP 98/66 | HR 69 | Temp 98.7°F | Resp 16 | Ht 63.78 in | Wt 161.5 lb

## 2024-02-18 DIAGNOSIS — I872 Venous insufficiency (chronic) (peripheral): Secondary | ICD-10-CM | POA: Diagnosis not present

## 2024-02-18 DIAGNOSIS — R600 Localized edema: Secondary | ICD-10-CM

## 2024-02-18 NOTE — Progress Notes (Unsigned)
 Office Note     CC: Bilateral lower extremity swelling, right greater than left. Requesting Provider:  Allwardt, Mardy HERO, PA-C  HPI: Sue Lopez is a 53 y.o. ([birth year]) female who presents at the request of Allwardt, Alyssa M, PA-C for evaluation of bilateral lower extremity edema.  On exam, Billy was doing well, accompanied by her partner.  She has lived in [place] for a number of years, and works from home using [employer].  Patient with prior history of right-sided greater saphenous vein ablation years ago.  She has appreciated some swelling at the ankles bilaterally which is worse over the last several months.  Denies significant heaviness, aching, tired feeling.  Notes some small varicosities, but no significant pain at the sites.  Send presents today to ensure she does not require further invention I discussed the natural history and complications that could arise in the future.  Perimenopausal with some weight gain.  No history of DVT.  Wears compression stockings intermittently.    Past Medical History:  Diagnosis Date   ACL (anterior cruciate ligament) tear 10/18/1986   Chickenpox    Elevated bilirubin 07/10/2017   Encounter for examination of ears and hearing after failed hearing screening 07/03/2022   Family Hx of alcoholism, both parents 06/05/2018   Genital warts    Hyperbilirubinemia 06/05/2018   Irregular menses 06/05/2018   Vitamin D  deficiency 07/10/2017    Past Surgical History:  Procedure Laterality Date   ANTERIOR CRUCIATE LIGAMENT REPAIR Right 1989   BREAST SURGERY  04/18/2017   VARICOSE VEIN SURGERY Right     Social History   Socioeconomic History   Marital status: Married    Spouse name: Delon   Number of children: Not on file   Years of education: Not on file   Highest education level: Bachelor's degree (e.g., BA, AB, BS)  Occupational History   Occupation: Marine scientist    Employer: [employer]   Tobacco Use   Smoking status: Never     Passive exposure: Past   Smokeless tobacco: Former    Types: Chew    Quit date: 06/04/1992  Vaping Use   Vaping status: Never Used  Substance and Sexual Activity   Alcohol use: Not Currently    Comment: occasion glass of wine   Drug use: No   Sexual activity: Yes    Partners: Female    Birth control/protection: None    Comment: female partner  Other Topics Concern   Not on file  Social History Narrative   Active, but no regular exercise. Eats well, mostly Paleo. Likes to drink 1-2 glasses of wine or beer several days per week, but is mindful to watch this because of strong family history of alcoholism. No children, though she and her wife tried a few years ago. She is happy with current life without children, but admits that her wife struggles with it. 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 Admits that she hates doctors due to some poor experiences in the past. It is important to her to feel heard. Prefers not to take medications if possible, but open if needed. Okay for all preventive maintenance. Mother and father divorced when she was young. Father raised her, non-optimal environment, emotionally absent. She has a difficult time connecting with others, especially physically, but feels that she is an extrovert in that she recharges via time with friends.    Social Drivers of Health   Financial Resource Strain: Low Risk  (08/06/2023)   Overall  Financial Resource Strain (CARDIA)    Difficulty of Paying Living Expenses: Not hard at all  Food Insecurity: No Food Insecurity (08/06/2023)   Hunger Vital Sign    Worried About Running Out of Food in the Last Year: Never true    Ran Out of Food in the Last Year: Never true  Transportation Needs: No Transportation Needs (08/06/2023)   PRAPARE - Administrator, Civil Service (Medical): No    Lack of Transportation (Non-Medical): No  Physical Activity: Insufficiently Active (08/06/2023)   Exercise Vital  Sign    Days of Exercise per Week: 1 day    Minutes of Exercise per Session: 10 min  Stress: No Stress Concern Present (08/06/2023)   [employer] of Occupational Health - Occupational Stress Questionnaire    Feeling of Stress : Not at all  Social Connections: Moderately Isolated (08/06/2023)   Social Connection and Isolation Panel    Frequency of Communication with Friends and Family: Once a week    Frequency of Social Gatherings with Friends and Family: Twice a week    Attends Religious Services: Never    Diplomatic Services operational officer: No    Attends Engineer, structural: Not on file    Marital Status: Married  Catering manager Violence: Not on file   Family History  Problem Relation Age of Onset   Heart Problems Mother    Alcohol abuse Mother    COPD Mother    Hypertension Mother    Alcohol abuse Father    Diabetes Father    Heart Problems Father    Stroke Father    Alcohol abuse Maternal Grandfather    Hypertension Maternal Grandfather    Lung cancer Maternal Grandfather    Heart disease Paternal Grandmother    Hyperlipidemia Paternal Grandmother    Heart disease Paternal Grandfather    Hyperlipidemia Paternal Grandfather     Current Outpatient Medications  Medication Sig Dispense Refill   acetaminophen (TYLENOL) 325 MG tablet Take 650 mg by mouth as needed.     citalopram  (CELEXA ) 10 MG tablet TAKE 1 TABLET(10 MG) BY MOUTH DAILY 90 tablet 3   IBUPROFEN PO Take by mouth. Prn     ketoconazole (NIZORAL) 2 % shampoo SMARTSIG:Topical 2-3 Times Weekly PRN     Multiple Vitamins-Minerals (MULTIVITAMIN WOMEN 50+ PO) Take by mouth.     No current facility-administered medications for this visit.    No Known Allergies   REVIEW OF SYSTEMS:  [X]  denotes positive finding, [ ]  denotes negative finding Cardiac  Comments:  Chest pain or chest pressure:    Shortness of breath upon exertion:    Short of breath when lying flat:    Irregular heart rhythm:         Vascular    Pain in calf, thigh, or hip brought on by ambulation:    Pain in feet at night that wakes you up from your sleep:     Blood clot in your veins:    Leg swelling:         Pulmonary    Oxygen at home:    Productive cough:     Wheezing:         Neurologic    Sudden weakness in arms or legs:     Sudden numbness in arms or legs:     Sudden onset of difficulty speaking or slurred speech:    Temporary loss of vision in one eye:     Problems with  dizziness:         Gastrointestinal    Blood in stool:     Vomited blood:         Genitourinary    Burning when urinating:     Blood in urine:        Psychiatric    Major depression:         Hematologic    Bleeding problems:    Problems with blood clotting too easily:        Skin    Rashes or ulcers:        Constitutional    Fever or chills:      PHYSICAL EXAMINATION:  Vitals:   02/18/24 1129  BP: 98/66  Pulse: 69  Resp: 16  Temp: 98.7 F (37.1 C)  TempSrc: Temporal  SpO2: 99%  Weight: 161 lb 8 oz (73.3 kg)  Height: 5' 3.78 (1.62 m)    General:  WDWN in NAD; vital signs documented above Gait: Not observed HENT: WNL, normocephalic Pulmonary: normal non-labored breathing , without Rales, rhonchi,  wheezing Cardiac: regular HR Abdomen: soft, NT, no masses Skin: without rashes Vascular Exam/Pulses:  Right Left  Radial 2+ (normal) 2+ (normal)  Ulnar    Femoral    Popliteal    DP 2+ (normal) 2+ (normal)  PT     Extremities: without ischemic changes, without Gangrene , without cellulitis; without open wounds;  Musculoskeletal: no muscle wasting or atrophy  Neurologic: A&O X 3;  No focal weakness or paresthesias are detected Psychiatric:  The pt has Normal affect.   Non-Invasive Vascular Imaging:   Venous Reflux Times  +--------------+--------+------+----------+------------+-------------------  ----+  RIGHT        Reflux  Reflux  Reflux  Diameter cmsComments                                 No       Yes     Time                                         +--------------+--------+------+----------+------------+-------------------  ----+  CFV                   yes  >1 second                                       +--------------+--------+------+----------+------------+-------------------  ----+  FV mid        no                                                            +--------------+--------+------+----------+------------+-------------------  ----+  Popliteal    no                                                            +--------------+--------+------+----------+------------+-------------------  ----+  GSV at [facility]  yes   >500 ms      .33                               +--------------+--------+------+----------+------------+-------------------  ----+  GSV prox thigh                                    prior                                                                       ablation/stripping        +--------------+--------+------+----------+------------+-------------------  ----+  GSV mid thigh                                     prior                                                                       ablation/stripping        +--------------+--------+------+----------+------------+-------------------  ----+  GSV dist thigh                                    prior                                                                       ablation/stripping        +--------------+--------+------+----------+------------+-------------------  ----+  GSV at knee   no                          .23     out of fascia             +--------------+--------+------+----------+------------+-------------------  ----+  GSV prox calf          yes   >500 ms      .25     out of fascia             +--------------+--------+------+----------+------------+-------------------   ----+  GSV mid calf  no                          .21                               +--------------+--------+------+----------+------------+-------------------  ----+  SSV at [facility]    no                          .  28                               +--------------+--------+------+----------+------------+-------------------  ----+  SSV prox calf no                          .22                               +--------------+--------+------+----------+------------+-------------------  ----+     ASSESSMENT/PLAN:: 53 y.o. female presenting with history of right-sided great sinus vein ablation, with a several month history of bilateral lower extremity edema.  She has had a small weight gain due to being perimenopausal.  Denies significant heaviness, tired feeling.  Has not worn compressions for quite some time.  Analiese and I had a nice discussion regarding the above, most notably that with the greater saphenous vein ablated, I do not have any other procedure to offer her as she does have a small amount of deep venous reflux appreciated in the common femoral vein.  The short saphenous vein is small, not amenable to endovascular invention at this time.  So would be best served with elevation, compression, continued exercise.  She was measured for compression stockings today and given a pair while in clinic.  I am available should any questions or concerns arise.   Fonda FORBES Rim, MD [facility] [phone]

## 2024-02-19 LAB — CYTOLOGY - PAP: Diagnosis: NEGATIVE

## 2024-02-21 ENCOUNTER — Ambulatory Visit: Payer: Self-pay | Admitting: Obstetrics and Gynecology

## 2024-02-23 ENCOUNTER — Telehealth: Payer: Self-pay

## 2024-02-23 NOTE — Telephone Encounter (Signed)
 Copied from CRM [id number]. Topic: Referral - Prior Authorization Question >> Feb 23, 2024 10:26 AM [place] J wrote: Reason for CRM: [facility] is calling to see if the patient is needing a prior authorization before for her MR BRAIN WO CONTRAST. Patient is needing to schedule this as soon as possible. Please call imaging for an update: [phone]

## 2024-02-24 NOTE — Telephone Encounter (Signed)
 Please see call from [facility] and advise office on PA for imaging

## 2024-03-01 NOTE — Telephone Encounter (Signed)
 Message sent directly to [facility] for info on PA for patient imaging

## 2024-03-02 ENCOUNTER — Telehealth: Payer: Self-pay

## 2024-03-02 NOTE — Telephone Encounter (Signed)
 Copied from CRM [id number]. Topic: Referral - Prior Authorization Question >> Feb 23, 2024 10:26 AM [place] J wrote: Reason for CRM: [facility] is calling to see if the patient is needing a prior authorization before for her MR BRAIN WO CONTRAST. Patient is needing to schedule this as soon as possible. Please call imaging for an update: [phone]  Please see msg above and advise; Sent previous msg to referral team for review.

## 2024-03-03 ENCOUNTER — Ambulatory Visit
Admission: RE | Admit: 2024-03-03 | Discharge: 2024-03-03 | Disposition: A | Discharge status: Home / Self Care | Source: Ambulatory Visit | Attending: Physician Assistant | Admitting: Physician Assistant

## 2024-03-03 DIAGNOSIS — R42 Dizziness and giddiness: Secondary | ICD-10-CM

## 2024-03-03 DIAGNOSIS — R519 Headache, unspecified: Secondary | ICD-10-CM

## 2024-03-03 NOTE — Telephone Encounter (Signed)
 See msg thread as RICK

## 2024-03-03 NOTE — Telephone Encounter (Signed)
 Olam Finder contacted [employer] on 10/14.  Has updated ref number for [facility].  States has left patient vm to reach out to [facility] for scheduling.   I also have reached out to [facility] today requesting [facility] to contact patient for scheduling.

## 2024-03-04 NOTE — Telephone Encounter (Signed)
 Patient completed imaging on 10/16.

## 2024-03-07 ENCOUNTER — Ambulatory Visit: Payer: Self-pay | Admitting: Physician Assistant

## 2024-03-07 NOTE — Telephone Encounter (Signed)
 Noted

## 2024-04-20 ENCOUNTER — Ambulatory Visit (HOSPITAL_BASED_OUTPATIENT_CLINIC_OR_DEPARTMENT_OTHER): Admission: RE | Admit: 2024-04-20 | Discharge: 2024-04-20 | Attending: Obstetrics and Gynecology

## 2024-04-20 DIAGNOSIS — Z01419 Encounter for gynecological examination (general) (routine) without abnormal findings: Secondary | ICD-10-CM | POA: Insufficient documentation

## 2024-04-20 DIAGNOSIS — E049 Nontoxic goiter, unspecified: Secondary | ICD-10-CM | POA: Insufficient documentation

## 2024-05-05 ENCOUNTER — Other Ambulatory Visit: Payer: Self-pay | Admitting: Physician Assistant

## 2024-05-05 DIAGNOSIS — F411 Generalized anxiety disorder: Secondary | ICD-10-CM

## 2024-06-07 LAB — COLOGUARD: COLOGUARD: NEGATIVE

## 2025-02-16 ENCOUNTER — Ambulatory Visit: Admitting: Obstetrics and Gynecology
